# Patient Record
Sex: Female | Born: 1945 | Race: Black or African American | Hispanic: No | Marital: Married | Smoking: Former smoker
Health system: Southern US, Community
[De-identification: ages and names within clinical notes are randomized; demographics above are authoritative.]

## PROBLEM LIST (undated history)

## (undated) DIAGNOSIS — K259 Gastric ulcer, unspecified as acute or chronic, without hemorrhage or perforation: Secondary | ICD-10-CM

## (undated) DIAGNOSIS — G4730 Sleep apnea, unspecified: Secondary | ICD-10-CM

## (undated) DIAGNOSIS — E039 Hypothyroidism, unspecified: Secondary | ICD-10-CM

## (undated) DIAGNOSIS — G43909 Migraine, unspecified, not intractable, without status migrainosus: Secondary | ICD-10-CM

## (undated) DIAGNOSIS — I209 Angina pectoris, unspecified: Secondary | ICD-10-CM

## (undated) DIAGNOSIS — E7800 Pure hypercholesterolemia, unspecified: Secondary | ICD-10-CM

## (undated) DIAGNOSIS — J449 Chronic obstructive pulmonary disease, unspecified: Secondary | ICD-10-CM

## (undated) DIAGNOSIS — I10 Essential (primary) hypertension: Secondary | ICD-10-CM

## (undated) DIAGNOSIS — I639 Cerebral infarction, unspecified: Secondary | ICD-10-CM

## (undated) DIAGNOSIS — M1990 Unspecified osteoarthritis, unspecified site: Secondary | ICD-10-CM

## (undated) DIAGNOSIS — F419 Anxiety disorder, unspecified: Secondary | ICD-10-CM

## (undated) DIAGNOSIS — I2510 Atherosclerotic heart disease of native coronary artery without angina pectoris: Secondary | ICD-10-CM

## (undated) DIAGNOSIS — Z8719 Personal history of other diseases of the digestive system: Secondary | ICD-10-CM

## (undated) DIAGNOSIS — R0602 Shortness of breath: Secondary | ICD-10-CM

## (undated) DIAGNOSIS — E079 Disorder of thyroid, unspecified: Secondary | ICD-10-CM

## (undated) HISTORY — PX: TUBAL LIGATION: SHX77

## (undated) HISTORY — PX: TONSILLECTOMY AND ADENOIDECTOMY: SUR1326

## (undated) HISTORY — PX: DILATION AND CURETTAGE OF UTERUS: SHX78

## (undated) HISTORY — PX: SHOULDER OPEN ROTATOR CUFF REPAIR: SHX2407

## (undated) HISTORY — PX: CARPAL TUNNEL RELEASE: SHX101

## (undated) HISTORY — PX: BUNIONECTOMY: SHX129

## (undated) HISTORY — PX: THYROID SURGERY: SHX805

## (undated) HISTORY — PX: CHOLECYSTECTOMY: SHX55

---

## 1979-02-12 HISTORY — PX: ABDOMINAL HYSTERECTOMY: SHX81

## 1990-07-13 HISTORY — PX: BILE DUCT EXPLORATION: SHX1225

## 2009-10-31 ENCOUNTER — Observation Stay (HOSPITAL_COMMUNITY): Admission: EM | Admit: 2009-10-31 | Discharge: 2009-10-31 | Payer: Self-pay | Admitting: Emergency Medicine

## 2010-01-11 ENCOUNTER — Ambulatory Visit: Payer: Self-pay | Admitting: Cardiology

## 2010-01-11 ENCOUNTER — Ambulatory Visit (HOSPITAL_COMMUNITY)
Admission: RE | Admit: 2010-01-11 | Discharge: 2010-01-11 | Payer: Self-pay | Source: Home / Self Care | Admitting: Neurology

## 2010-01-11 ENCOUNTER — Ambulatory Visit: Payer: Self-pay

## 2010-01-11 ENCOUNTER — Encounter (INDEPENDENT_AMBULATORY_CARE_PROVIDER_SITE_OTHER): Payer: Self-pay | Admitting: Neurology

## 2010-01-11 HISTORY — PX: CATARACT EXTRACTION W/ INTRAOCULAR LENS  IMPLANT, BILATERAL: SHX1307

## 2010-01-12 ENCOUNTER — Encounter: Payer: Self-pay | Admitting: Cardiology

## 2010-03-15 DIAGNOSIS — I639 Cerebral infarction, unspecified: Secondary | ICD-10-CM

## 2010-03-15 HISTORY — DX: Cerebral infarction, unspecified: I63.9

## 2010-03-17 ENCOUNTER — Emergency Department (HOSPITAL_COMMUNITY): Payer: Medicare Other

## 2010-03-17 ENCOUNTER — Inpatient Hospital Stay (HOSPITAL_COMMUNITY)
Admission: EM | Admit: 2010-03-17 | Discharge: 2010-03-22 | DRG: 065 | Disposition: A | Discharge status: Skilled Nursing Facility | Payer: Medicare Other | Attending: Family Medicine | Admitting: Family Medicine

## 2010-03-17 DIAGNOSIS — E039 Hypothyroidism, unspecified: Secondary | ICD-10-CM

## 2010-03-17 DIAGNOSIS — Z8673 Personal history of transient ischemic attack (TIA), and cerebral infarction without residual deficits: Secondary | ICD-10-CM

## 2010-03-17 DIAGNOSIS — I2510 Atherosclerotic heart disease of native coronary artery without angina pectoris: Secondary | ICD-10-CM | POA: Diagnosis present

## 2010-03-17 DIAGNOSIS — I6340 Cerebral infarction due to embolism of unspecified cerebral artery: Principal | ICD-10-CM | POA: Diagnosis present

## 2010-03-17 DIAGNOSIS — Z87891 Personal history of nicotine dependence: Secondary | ICD-10-CM

## 2010-03-17 DIAGNOSIS — G8190 Hemiplegia, unspecified affecting unspecified side: Secondary | ICD-10-CM | POA: Diagnosis present

## 2010-03-17 DIAGNOSIS — E119 Type 2 diabetes mellitus without complications: Secondary | ICD-10-CM | POA: Diagnosis present

## 2010-03-17 DIAGNOSIS — M25519 Pain in unspecified shoulder: Secondary | ICD-10-CM | POA: Diagnosis not present

## 2010-03-17 DIAGNOSIS — G4700 Insomnia, unspecified: Secondary | ICD-10-CM | POA: Diagnosis present

## 2010-03-17 DIAGNOSIS — I10 Essential (primary) hypertension: Secondary | ICD-10-CM | POA: Diagnosis present

## 2010-03-17 DIAGNOSIS — E669 Obesity, unspecified: Secondary | ICD-10-CM | POA: Diagnosis present

## 2010-03-17 DIAGNOSIS — Z881 Allergy status to other antibiotic agents status: Secondary | ICD-10-CM

## 2010-03-17 DIAGNOSIS — Z794 Long term (current) use of insulin: Secondary | ICD-10-CM

## 2010-03-17 DIAGNOSIS — I6350 Cerebral infarction due to unspecified occlusion or stenosis of unspecified cerebral artery: Secondary | ICD-10-CM

## 2010-03-17 DIAGNOSIS — M25569 Pain in unspecified knee: Secondary | ICD-10-CM | POA: Diagnosis not present

## 2010-03-17 DIAGNOSIS — I639 Cerebral infarction, unspecified: Secondary | ICD-10-CM

## 2010-03-17 DIAGNOSIS — E7800 Pure hypercholesterolemia, unspecified: Secondary | ICD-10-CM | POA: Diagnosis present

## 2010-03-17 DIAGNOSIS — E785 Hyperlipidemia, unspecified: Secondary | ICD-10-CM | POA: Diagnosis present

## 2010-03-17 DIAGNOSIS — F411 Generalized anxiety disorder: Secondary | ICD-10-CM | POA: Diagnosis present

## 2010-03-17 DIAGNOSIS — Z882 Allergy status to sulfonamides status: Secondary | ICD-10-CM

## 2010-03-17 DIAGNOSIS — Z7982 Long term (current) use of aspirin: Secondary | ICD-10-CM

## 2010-03-17 LAB — CK TOTAL AND CKMB (NOT AT ARMC)
CK, MB: 2 ng/mL (ref 0.3–4.0)
Relative Index: 0.8 (ref 0.0–2.5)

## 2010-03-17 LAB — POCT CARDIAC MARKERS: Myoglobin, poc: 119 ng/mL (ref 12–200)

## 2010-03-17 LAB — COMPREHENSIVE METABOLIC PANEL
AST: 30 U/L (ref 0–37)
Albumin: 4.1 g/dL (ref 3.5–5.2)
Chloride: 103 mEq/L (ref 96–112)
Creatinine, Ser: 1 mg/dL (ref 0.4–1.2)
GFR calc Af Amer: >60 mL/min (ref >60)
Total Bilirubin: 0.5 mg/dL (ref 0.3–1.2)
Total Protein: 7.4 g/dL (ref 6.0–8.3)

## 2010-03-17 LAB — BASIC METABOLIC PANEL
CO2: 23 mEq/L (ref 19–32)
GFR calc non Af Amer: >60 mL/min (ref >60)
Glucose, Bld: 280 mg/dL — ABNORMAL HIGH (ref 70–99)
Potassium: 4.2 mEq/L (ref 3.5–5.1)
Sodium: 139 mEq/L (ref 135–145)

## 2010-03-17 LAB — URINALYSIS, ROUTINE W REFLEX MICROSCOPIC
Hgb urine dipstick: NEGATIVE
Protein, ur: NEGATIVE mg/dL
Specific Gravity, Urine: 1.015 (ref 1.005–1.030)
Urine Glucose, Fasting: 250 mg/dL — AB
pH: 7 (ref 5.0–8.0)

## 2010-03-17 LAB — PROTIME-INR: INR: 0.99 (ref 0.00–1.49)

## 2010-03-17 LAB — CBC
MCHC: 32.9 g/dL (ref 30.0–36.0)
RDW: 12.4 % (ref 11.5–15.5)

## 2010-03-18 LAB — CARDIAC PANEL(CRET KIN+CKTOT+MB+TROPI)
CK, MB: 1.8 ng/mL (ref 0.3–4.0)
CK, MB: 1.8 ng/mL (ref 0.3–4.0)
Relative Index: 0.6 (ref 0.0–2.5)
Relative Index: 0.6 (ref 0.0–2.5)
Total CK: 283 U/L — ABNORMAL HIGH (ref 7–177)
Troponin I: 0.01 ng/mL (ref 0.00–0.06)
Troponin I: 0.01 ng/mL (ref 0.00–0.06)

## 2010-03-18 LAB — BASIC METABOLIC PANEL
Calcium: 9.1 mg/dL (ref 8.4–10.5)
GFR calc Af Amer: >60 mL/min (ref >60)
GFR calc non Af Amer: >60 mL/min (ref >60)
Potassium: 3.8 mEq/L (ref 3.5–5.1)
Sodium: 138 mEq/L (ref 135–145)

## 2010-03-18 LAB — LIPID PANEL
HDL: 32 mg/dL — ABNORMAL LOW (ref >39)
Total CHOL/HDL Ratio: 5.8 RATIO
VLDL: 50 mg/dL — ABNORMAL HIGH (ref 0–40)

## 2010-03-18 LAB — HEMOGLOBIN A1C: Hgb A1c MFr Bld: 7.8 % — ABNORMAL HIGH (ref <5.7)

## 2010-03-19 ENCOUNTER — Observation Stay (HOSPITAL_COMMUNITY): Payer: Medicare Other

## 2010-03-19 DIAGNOSIS — G459 Transient cerebral ischemic attack, unspecified: Secondary | ICD-10-CM

## 2010-03-19 LAB — GLUCOSE, CAPILLARY
Glucose-Capillary: 101 mg/dL — ABNORMAL HIGH (ref 70–99)
Glucose-Capillary: 131 mg/dL — ABNORMAL HIGH (ref 70–99)

## 2010-03-20 LAB — GLUCOSE, CAPILLARY: Glucose-Capillary: 162 mg/dL — ABNORMAL HIGH (ref 70–99)

## 2010-03-21 ENCOUNTER — Inpatient Hospital Stay (HOSPITAL_COMMUNITY): Payer: Medicare Other

## 2010-03-21 DIAGNOSIS — E119 Type 2 diabetes mellitus without complications: Secondary | ICD-10-CM

## 2010-03-21 DIAGNOSIS — I6350 Cerebral infarction due to unspecified occlusion or stenosis of unspecified cerebral artery: Secondary | ICD-10-CM

## 2010-03-21 DIAGNOSIS — I10 Essential (primary) hypertension: Secondary | ICD-10-CM

## 2010-03-21 DIAGNOSIS — E039 Hypothyroidism, unspecified: Secondary | ICD-10-CM

## 2010-03-21 LAB — GLUCOSE, CAPILLARY
Glucose-Capillary: 197 mg/dL — ABNORMAL HIGH (ref 70–99)
Glucose-Capillary: 260 mg/dL — ABNORMAL HIGH (ref 70–99)
Glucose-Capillary: 220 mg/dL — ABNORMAL HIGH (ref 70–99)

## 2010-03-22 LAB — GLUCOSE, CAPILLARY
Glucose-Capillary: 189 mg/dL — ABNORMAL HIGH (ref 70–99)
Glucose-Capillary: 79 mg/dL (ref 70–99)

## 2010-03-22 LAB — CBC
HCT: 37 % (ref 36.0–46.0)
MCV: 86.4 fL (ref 78.0–100.0)
RDW: 12.8 % (ref 11.5–15.5)
WBC: 9.9 10*3/uL (ref 4.0–10.5)

## 2010-03-22 LAB — BASIC METABOLIC PANEL
BUN: 8 mg/dL (ref 6–23)
GFR calc non Af Amer: >60 mL/min (ref >60)
Glucose, Bld: 146 mg/dL — ABNORMAL HIGH (ref 70–99)
Potassium: 3.5 mEq/L (ref 3.5–5.1)

## 2010-03-26 NOTE — Discharge Summary (Signed)
[name], [name] NO.:  [id number]  MEDICAL RECORD NO.:  [id number]           PATIENT TYPE:  E  LOCATION:  [facility]                         FACILITY:  [facility]  PHYSICIAN:  Barbara Ramp, MD        DATE OF BIRTH:  January 07, 1946  DATE OF ADMISSION:  03/17/2010 DATE OF DISCHARGE:                              DISCHARGE SUMMARY   PRIMARY CARE PHYSICIAN:  [name], MD at [facility].  DISCHARGE DIAGNOSES: 1. 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 2. Hypertension. 3. Hyperlipidemia. 4. Diabetes. 5. Anxiety. 6. Insomnia. 7. Hypothyroidism. 8. Coronary artery disease. 9. History of transient ischemic attack 2-3 years ago.  DISCHARGE MEDICATIONS: 1. Norvasc 5 mg p.o. daily. 2. Aspirin 325 mg p.o. daily. 3. Celexa 20 mg p.o. daily. 4. Plavix 75 mg p.o. daily. 5. Xalatan eyedrops 1 drop in each eye at bedtime. 6. Levothyroxine 75 mcg p.o. daily. 7. Simvastatin 20 mg p.o. at bedtime. 8. Humulin 70/30 of 60 units q.a.m. and 30 units at bedtime.  CONSULTATION:  Neurology.  LABORATORY DATA:  Pertinent labs and studies; 1. Head CT on March 17, 2010 that showed acute/subacute right     parietal infarct.  Thrombus versus slow flow within the right     middle cerebral artery suspected.  Underlying age-advanced small-     vessel ischemic change. 2. MRI of the head on March 17, 2010 that showed occlusion of the     right middle cerebral artery immediately beyond the first branch.     A 4-5 cm region of acute infarction affecting the insula and     parietal region of the right hemisphere swelling, but no hemorrhage     or shift. 3. MRI of the right knee on March 19, 2010 that showed mild     tricompartmental degenerative changes.  No acute bony findings.     Mild mucoid degeneration of the ACL.  No meniscal tears.  Distal     quadriceps tendinopathy without tear.  No joint effusion. 4. CT head on March 19, 2010 that showed  interval evolution of right     parietal infarct noted, without evidence for hemorrhagic conversion     allowing for technique. 5. Left shoulder x-ray on March 21, 2010 that showed no acute     abnormalities.  Moderately severe arthritis. 6. Fasting lipid profile that showed total cholesterol 187, LDL 105,     HDL 32, and triglyceride 251. 7. TSH that was 1.319. 8. Hemoglobin A1c was 7.8. 9. A 2-D echo that showed systolic function was normal with an     ejection fraction of 55-60%.  BRIEF HOSPITAL COURSE: 1. This is a 65 year old female with a past medical history of     hypertension, hyperlipidemia, and diabetes who presented with left     lower extremity weakness, some confusion with initial MRI that     showed acute infarct affecting the insula and parietal region.  The     patient had subsequent 2-D echo that did not reveal any source for     embolic stroke with  normal ejection fraction of 55-60%.  During the     course of hospitalization, the patient's stroke progressed from     left-sided hemineglect to left-sided paralysis.  The patient was     seen by Neurology, Physical Therapy, and Speech Therapy during this     hospitalization for assessment and plan.  Per Neurology, the     patient is to continue on the Plavix for future stroke prevention.     Her blood pressure remained stable during this hospitalization with     discharge blood pressure of 134/75.  The patient was evaluated by     Physical Therapy, who recommended the patient be placed in a     skilled nursing facility with rehabilitation facility for     improvement of quality of life.  The patient was evaluated by     Speech Therapy, and was recommended to have a dysphagia II diet.     The patient does have great support in her daughters, who live out     of state and are coming to stay with the patient during her     transition to a skilled nursing facility. 2. Hypertension.  The patient is to continue on her  medication of     Norvasc 5 mg.  Her blood pressure has been well controlled during     this hospitalization. 3. Hyperlipidemia.  Fasting lipid panel is as stated in above.  The     patient is to continue on simvastatin 20 mg p.o. at bedtime for     optimal control of cholesterol for secondary stroke prevention. 4. Diabetes.  Hemoglobin A1c during this hospitalization was 7.8.  It     is important that the patient has tight glycemic control for     prevention of further risk factors and for ischemia.  She will     continue on Humalog 70/30 with 30 units in the night and 60 units     in the a.m. 5. Hypothyroidism.  Her TSH was found to be within normal limits at     1.319.  The patient is to continue on her levothyroxine at 75 mcg     p.o. daily. 6. Depression.  The patient is to continue on Celexa 20 mg p.o. daily.     An SSRI has been shown to improve outcome after stroke, optimal     reason the patient should stay on this medication.  DISCHARGE INSTRUCTIONS: 1. The patient is discharged to a skilled nursing facility with plans     for rehabilitation. 2. Activity is as per Physical Therapy. 3. Diet.  Dysphagia II diet. 4. The patient is to have followup with Neurology per Neurology.  The patient was discharged in stable medical condition to skilled nursing facility.    Barbara Slim, MD   ______________________________ Barbara Ramp, MD   CT/MEDQ  D:  03/22/2010  T:  03/22/2010  Job:  [id number]  cc:   [name], MD  Electronically Signed by CAT TA MD on 03/25/2010 04:02:30 PM Electronically Signed by Denny Levy MD on 03/26/2010 11:20:36 AM

## 2010-04-09 NOTE — H&P (Signed)
[name], [name]                ACCOUNT NO.:  [id number]  MEDICAL RECORD NO.:  [id number]           PATIENT TYPE:  E  LOCATION:  [facility]                         FACILITY:  [facility]  PHYSICIAN:  Paula Compton, MD        DATE OF BIRTH:  1945/09/15  DATE OF ADMISSION:  03/17/2010 DATE OF DISCHARGE:                             HISTORY & PHYSICAL   CHIEF COMPLAINT:  Weakness.  PRIMARY CARE PROVIDER:  [name], Dr. Alwyn Ren.  HISTORY OF PRESENT ILLNESS:  A 65 year old female who awoke last night with confusion and weakness of the left upper extremity.  Husband says she was "talking like a baby" and endorsed left arm weakness and tingling.  The patient states she was dropping things from her hand and felt very incoordinated.  Of note, 1 week prior the patient had similar symptoms with tingling and numbness in the left hand, which resolved within minutes.  The patient went to [name] this a.m., concerned for CVA and was sent to the ED.  Upon evaluation in the ED, the patient was at baseline.  There were no focal neurological deficits. Symptoms have resolved completely.  The patient denied any visual changes at that time, although she does have a history of cataracts and glaucoma.  REVIEW OF SYSTEMS:  No chest pain.  No fever.  No shortness of breath. No change in bowel or bladder.  No recent illness.  Positive weakness. No dysuria, otherwise per above.  EMERGENCY DEPARTMENT COURSE:  The patient was given aspirin 325, fentanyl 100 mg x1, and Zofran 4 mg.  Of note, the patient also endorsed knee pain because she states she fell being uncoordinated this morning, did not hit her head, but hit her knee upon the ground.  ALLERGIES:  SULFA, FLAGYL, ERYTHROMYCIN.  MEDICATIONS: 1. Hydrochlorothiazide 12.5 mg daily. 2. Ambien 10 mg nightly p.r.n. 3. Synthroid 75 mcg daily. 4. VESIcare p.r.n. 5. Humulin 70/30, 9 units q.a.m., 70 units q.p.m. 6. Xalatan eye drops daily. 7.  Azor. 8. Amlodipine. 9. Olmesartan 10/20 mg daily. 10.Aspirin 325 mg daily. 11.Celexa 20 mg daily.  PAST MEDICAL HISTORY: 1. Hypertension. 2. Hyperlipidemia. 3. Diabetes mellitus, type 2. 4. Anxiety. 5. Insomnia. 6. Hypothyroidism. 7. Coronary artery disease.  The patient says she has a blockage;     however, there was no stent placed. 8. History of TIA 2-3 years ago.  PAST SURGICAL HISTORY: 1. Cataract surgery. 2. Bilateral rotator cuff surgery. 3. Cholecystectomy. 4. History of carpal tunnel. 5. History glaucoma. 6. Thyroid surgery, and therefore she was started on Synthroid, could     not give details. 7. Hysterectomy.  SOCIAL HISTORY:  The patient is married.  On disability for carpal tunnel and foot surgeries.  Previously worked at [employer] No tobacco, quit in 1985.  No alcohol.  No illicit drug use.  Has multiple children who currently reside in [place] and [place].  Of note, the patient made to [name] with her husband in July 2011.  FAMILY HISTORY:  Only history of mother is known.  She had diabetes mellitus and end-stage renal disease and  is currently deceased.  No family history of stroke history is known.  PHYSICAL EXAMINATION:  VITAL SIGNS:  Temperature 98.2, heart rate 87, respiratory rate 16, blood pressure 141/63, 95% on room air. GENERAL:  In no acute distress, alert, and oriented x3. HEENT:  3 mm pupils, reactive.  EOMI.  Nonicteric. NECK:  No JVD.  Supple.  No lymphadenopathy. CVS:  Regular rate and rhythm.  No murmur. RESPIRATORY:  CTAB. EXTREMITIES:  No edema.  Right knee tender to palpation of the right patella.  No effusion.  Ligaments intact.  No ecchymosis. NEURO:  The patient follows commands, has difficulty spelling the word world backwards.  Alert and oriented x4.  No hallucinations, but is crying during exam.  Motor is 5/5 bilateral lower extremities.  Motor is equal in the upper extremities.  There is no facial droop.   Sensation is grossly intact.  The patient has normal tone.  Of note per nursing, when the patient was walked, she had ataxia.  Also noted, the patient although was oriented, had some confusion with some commands and began crying.  She was unable to do rapid alternating movements, but did not have any dysmetria.  LABORATORY DATA:  Sodium 139, potassium 4.2, chloride 105, CO2 of 23, BUN 10, creatinine 0.88, glucose 280, calcium 9.4.  PTT 24, INR is 0.99. Point-of-care negative x1.  CBC:  White count 8.9, hemoglobin 12.6, hematocrit 38.3, platelets 289.  IMAGING: 1. CT of head, acute on subacute right parietal infarct, small     physical changes, thrombosed versus slow flow in the right MCA     suspected. 2. MRA in September 2011 showed narrowing in the focal region of the     right MCA which is likely flow reducing.  ASSESSMENT AND PLAN:  A 65-year female admitted with weakness, confusion, concern for transient ischemic attack/subacute cerebrovascular accident. 1. Transient ischemic attack.  We will admit to Neurology for     transient ischemic attack workup.  CT of head shows probable acute     or subacute area of ischemia in the parietal region.  We will     obtain MRI/MRA to visualize ischemia and further differentiate the     region of flow that is decreased in the right middle cerebral     artery.  We will obtain cardiac enzymes and fasting lipid panel.     The patient is currently on full-dose aspirin per report.     Therefore, we will stop Plavix 75 mg daily.  PT/OT. 2. Hypertension.  Blood pressure is fairly controlled.  No medications     taken this a.m. Will allow permissive hypertension.  We will start low-dose     Norvasc 5 mg daily. 3. Diabetes mellitus.  Check A1c.  Continue 70/30 as the patient     currently tolerating p.o. titrate it up as needed sliding scale     insulin. 4. Hyperlipidemia.  Check fasting lipid panel.  No medications     currently, however, will  need statin on discharge. 5. Coronary artery disease.  The patient unable to stand.  We will     cycle cardiac enzymes.  No chest pain at this time.  Obtain EKG. 6. Knee pain.  We will obtain x-ray of right knee.  Start Vicodin     p.r.n. 7. Hypothyroidism.  Check TSH. 8. Prophylaxis.  Lovenox subcu daily. 9. Fluids, electrolytes, nutrition/gastrointestinal.  Diabetic diet.     Saline lock IV fluids.  DISPOSITION:  Pending clinical improvement.  The patient is a full code. Social work to help with medications.     Milinda Antis, MD   ______________________________ Paula Compton, MD    KD/MEDQ  D:  03/17/2010  T:  03/18/2010  Job:  [id number]  Electronically Signed by Milinda Antis MD on 03/27/2010 07:47:09 PM Electronically Signed by Paula Compton MD on 04/09/2010 09:47:13 AM

## 2010-04-16 NOTE — Consult Note (Signed)
NAMEROBBIE, NANGLE                ACCOUNT NO.:  [id number]  MEDICAL RECORD NO.:  [id number]           PATIENT TYPE:  E  LOCATION:  [facility]                         FACILITY:  [facility]  PHYSICIAN:  Joycelyn Schmid, MD   DATE OF BIRTH:  [birth year]  DATE OF CONSULTATION:  03/19/2010 DATE OF DISCHARGE:                                CONSULTATION   CHIEF COMPLAINT:  Left arm weakness.  HISTORY OF PRESENT ILLNESS:  The patient is a 65 year old female with past medical history of TIA, coronary artery disease, diabetes type 2, hypertension, obesity, and medical noncompliance, who developed left- sided weakness 3 days ago while going to the bed.  The patient thought that her weakness in the left lower extremity was secondary to some circulation problem and went to bed.  The patient woke up and continued to feel weak on the left side including the arms and legs.  The patient at that time went to the urgent care and sought further help.  The patient denies having any seizures, trauma, bowel or bladder disturbance at that time.  The patient reports headache which is mainly retro- orbital without any visual changes according to the patient.  The patient reports that her weakness had progressed initially, but is now improving; however, this cannot be confirmed with her family members. The patient does not recall having any palpitation or chest pain.  The patient does not report any fevers, nausea, or vomiting prior to today. The patient did develop some nausea and vomiting at the time of examination.  PAST MEDICAL HISTORY: 1. Hypertension. 2. Diabetes. 3. High cholesterol. 4. TIA x2, resolved without any deficits according to the patient;     first episode was 5 years ago, another one was about 6 months ago. 5. Coronary artery disease. 6. Cataract surgery. 7. Cholecystectomy surgery. 8. Thyroid surgery.  MEDICATIONS: 1. Hydrochlorothiazide. 2. Ambien. 3. Synthroid. 4. VESIcare. 5.  Humulin 90 units in the morning, 70 units in the evening. 6. Xalatan eyedrops. 7. Norvasc. 8. Aspirin. 9. Celexa.  ALLERGIES:  SULFA, FLAGYL, and ERYTHROMYCIN.  FAMILY MEDICAL HISTORY:  Mother had diabetes.  The patient does not know family history from her father's side.  SOCIAL HISTORY:  The patient is not smoking at present.  The patient did smoke until 1985.  The patient denies having any alcohol addiction or illicit drug use.  REVIEW OF SYMPTOMS:  All 10 systems were reviewed and were all negative except as mentioned in the HPI.  PHYSICAL EXAMINATION:  VITAL SIGNS:  Blood pressure 138/80, pulse of 90, respiratory rate of 20, 95% saturation on room air, temperature is 97. MENTAL STATUS:  Alert and oriented x3.  Carries out two-step commands. Cranial Nerves:  Eyes, extraocular movement is intact.  There is ptosis. There is a left lower visual field defect on the lateral side.  Slow extraocular movements, however, they are still present.  Face is symmetric and the tongue is midline as well as the uvula is also midline.  There is no dysarthria or aphasia.  The patient does not have any facial droop.  The patient has a left-sided sensation deficit  including on the head in V1-V3 distribution.  Shoulder and head turn are normal.  Coordination:  The patient's finger-to-nose coordination is good on right side, however, on the left side, it is impaired by muscle weakness as well as spatial loss for the muscle movement from the possible parietal stroke.  Gait was not assessed.  The patient had a fall in the past.  Motor:  Right upper extremity and lower extremity are 5/5, left-sided hemineglect, left-sided parietal drift.  Left arm weakness about 3-4/5, left leg is 4/5.  Deep tendon reflexes are normal on the right side and diminished on the left.  Able to read parts of the words and sentences, but cannot read clearly. PULMONARY:  Clear to auscultation bilaterally.  No rhonchi, no  rales, no wheezing. CARDIOVASCULAR:  S1 and S2 normal.  No murmur. NECK:  Does not have any bruit.  Sensation is normal except on the left side arm and leg, there is a loss of sensation which includes spine as well as crude touch.  LABORATORY DATA:  Sodium 138, potassium 3.8, chloride 102, bicarb 24, BUN 9, creatinine 9.94, glucose is 271.  Hemoglobin is 12.6, white count is 8.9, platelet 284.  A1c is 7.8.  A 2-echo showed 55-60% ejection fraction without any evidence of embolic source.  Triglyceride is 251, cholesterol is 187, HDL is 32, LDL is 105.  IMAGING STUDIES:  MRI shows 4-5 cm region of acute infarct, affecting insula and parietal region.  ASSESSMENT AND PLAN:  The patient is a 65 year old female with multiple risk factors for cardiovascular attack, presenting with 3-day old left- sided weakness which is progressing.  MRI shows acute infarct affecting insula and parietal region.  Deficits are stable and there is a left- sided hemineglect.  The patient's 2-D echo and Dopplers were performed today and did not reveal any source for embolic stroke as well as any internal carotid artery stenosis.  The patient could not afford Plavix in the past and therefore, was only taking aspirin.  We will recommend that the patient should be on a single antiplatelet therapy, either being aspirin 325 mg once a day or Plavix 75 mg once a day.  We will also advise that TEE be obtained if TTE is unrevealing.  The patient, at the time of exam, had some nausea and vomiting and some progression of deficit according to nursing staff.  CT scan was ordered to further evaluate progression of stroke and possibility of increased intracranial pressure.  We will recommend the risk factor be aggressively controlled including diabetes, hypertension, and compliance be assured.  Thank you for consultation.     Clerance Lav, MD PhD   ______________________________ Joycelyn Schmid, MD    RS/MEDQ   D:  03/19/2010  T:  03/20/2010  Job:  [id number]  Electronically Signed by Clerance Lav MD PHD on 03/20/2010 04:55:50 PM Electronically Signed by Joycelyn Schmid  on 04/16/2010 12:33:38 PM

## 2010-04-26 LAB — CBC
HCT: 36.7 % (ref 36.0–46.0)
Hemoglobin: 12.3 g/dL (ref 12.0–15.0)
WBC: 9.4 10*3/uL (ref 4.0–10.5)

## 2010-04-26 LAB — URINALYSIS, ROUTINE W REFLEX MICROSCOPIC
Bilirubin Urine: NEGATIVE
Hgb urine dipstick: NEGATIVE
Protein, ur: NEGATIVE mg/dL
Urobilinogen, UA: 0.2 mg/dL (ref 0.0–1.0)

## 2010-04-26 LAB — POCT CARDIAC MARKERS
CKMB, poc: 1.3 ng/mL (ref 1.0–8.0)
Myoglobin, poc: 106 ng/mL (ref 12–200)
Troponin i, poc: <0.05 ng/mL (ref 0.00–0.09)

## 2010-04-26 LAB — DIFFERENTIAL
Lymphs Abs: 2.5 10*3/uL (ref 0.7–4.0)
Monocytes Relative: 5 % (ref 3–12)
Neutro Abs: 6.3 10*3/uL (ref 1.7–7.7)
Neutrophils Relative %: 67 % (ref 43–77)

## 2010-04-26 LAB — COMPREHENSIVE METABOLIC PANEL
BUN: 13 mg/dL (ref 6–23)
Calcium: 9.5 mg/dL (ref 8.4–10.5)
Glucose, Bld: 320 mg/dL — ABNORMAL HIGH (ref 70–99)
Sodium: 139 mEq/L (ref 135–145)
Total Protein: 7.1 g/dL (ref 6.0–8.3)

## 2010-04-26 LAB — GLUCOSE, CAPILLARY: Glucose-Capillary: 131 mg/dL — ABNORMAL HIGH (ref 70–99)

## 2010-04-26 LAB — URINE MICROSCOPIC-ADD ON

## 2010-07-04 ENCOUNTER — Emergency Department (HOSPITAL_COMMUNITY): Payer: Medicare Other

## 2010-07-04 ENCOUNTER — Emergency Department (HOSPITAL_COMMUNITY)
Admission: EM | Admit: 2010-07-04 | Discharge: 2010-07-05 | Disposition: A | Discharge status: Home / Self Care | Payer: Medicare Other | Attending: Emergency Medicine | Admitting: Emergency Medicine

## 2010-07-04 DIAGNOSIS — Z7982 Long term (current) use of aspirin: Secondary | ICD-10-CM | POA: Insufficient documentation

## 2010-07-04 DIAGNOSIS — Z79899 Other long term (current) drug therapy: Secondary | ICD-10-CM | POA: Insufficient documentation

## 2010-07-04 DIAGNOSIS — E785 Hyperlipidemia, unspecified: Secondary | ICD-10-CM | POA: Insufficient documentation

## 2010-07-04 DIAGNOSIS — I10 Essential (primary) hypertension: Secondary | ICD-10-CM | POA: Insufficient documentation

## 2010-07-04 DIAGNOSIS — Z794 Long term (current) use of insulin: Secondary | ICD-10-CM | POA: Insufficient documentation

## 2010-07-04 DIAGNOSIS — R29898 Other symptoms and signs involving the musculoskeletal system: Secondary | ICD-10-CM | POA: Insufficient documentation

## 2010-07-04 DIAGNOSIS — Z8673 Personal history of transient ischemic attack (TIA), and cerebral infarction without residual deficits: Secondary | ICD-10-CM | POA: Insufficient documentation

## 2010-07-04 DIAGNOSIS — E039 Hypothyroidism, unspecified: Secondary | ICD-10-CM | POA: Insufficient documentation

## 2010-07-04 DIAGNOSIS — E119 Type 2 diabetes mellitus without complications: Secondary | ICD-10-CM | POA: Insufficient documentation

## 2010-07-04 DIAGNOSIS — R51 Headache: Secondary | ICD-10-CM | POA: Insufficient documentation

## 2010-07-04 DIAGNOSIS — R000 Tachycardia, unspecified: Secondary | ICD-10-CM | POA: Insufficient documentation

## 2010-07-04 LAB — DIFFERENTIAL
Basophils Absolute: 0.1 10*3/uL (ref 0.0–0.1)
Basophils Relative: 1 % (ref 0–1)
Eosinophils Absolute: 0.4 10*3/uL (ref 0.0–0.7)
Eosinophils Relative: 3 % (ref 0–5)
Lymphocytes Relative: 47 % — ABNORMAL HIGH (ref 12–46)
Lymphs Abs: 5.3 10*3/uL — ABNORMAL HIGH (ref 0.7–4.0)
Monocytes Absolute: 0.8 10*3/uL (ref 0.1–1.0)
Monocytes Relative: 7 % (ref 3–12)
Neutro Abs: 4.7 10*3/uL (ref 1.7–7.7)
Neutrophils Relative %: 42 % — ABNORMAL LOW (ref 43–77)

## 2010-07-04 LAB — BASIC METABOLIC PANEL
BUN: 15 mg/dL (ref 6–23)
CO2: 22 mEq/L (ref 19–32)
Calcium: 9.7 mg/dL (ref 8.4–10.5)
Chloride: 101 mEq/L (ref 96–112)
Creatinine, Ser: 0.65 mg/dL (ref 0.4–1.2)
GFR calc Af Amer: >60 mL/min (ref >60)
GFR calc non Af Amer: >60 mL/min (ref >60)
Glucose, Bld: 304 mg/dL — ABNORMAL HIGH (ref 70–99)
Potassium: 4 mEq/L (ref 3.5–5.1)
Sodium: 136 mEq/L (ref 135–145)

## 2010-07-04 LAB — CBC
HCT: 39.8 % (ref 36.0–46.0)
MCHC: 33.9 g/dL (ref 30.0–36.0)
RDW: 12.3 % (ref 11.5–15.5)

## 2010-07-04 LAB — POCT CARDIAC MARKERS: Myoglobin, poc: 81 ng/mL (ref 12–200)

## 2010-07-14 ENCOUNTER — Emergency Department (HOSPITAL_COMMUNITY): Payer: Medicare Other

## 2010-07-14 ENCOUNTER — Observation Stay (HOSPITAL_COMMUNITY)
Admission: EM | Admit: 2010-07-14 | Discharge: 2010-07-23 | DRG: 881 | Disposition: A | Discharge status: Home Health | Payer: Medicare Other | Attending: Family Medicine | Admitting: Family Medicine

## 2010-07-14 DIAGNOSIS — Z993 Dependence on wheelchair: Secondary | ICD-10-CM | POA: Insufficient documentation

## 2010-07-14 DIAGNOSIS — R0609 Other forms of dyspnea: Secondary | ICD-10-CM | POA: Insufficient documentation

## 2010-07-14 DIAGNOSIS — Z79899 Other long term (current) drug therapy: Secondary | ICD-10-CM | POA: Insufficient documentation

## 2010-07-14 DIAGNOSIS — F329 Major depressive disorder, single episode, unspecified: Secondary | ICD-10-CM | POA: Insufficient documentation

## 2010-07-14 DIAGNOSIS — M6281 Muscle weakness (generalized): Secondary | ICD-10-CM | POA: Insufficient documentation

## 2010-07-14 DIAGNOSIS — Z8673 Personal history of transient ischemic attack (TIA), and cerebral infarction without residual deficits: Secondary | ICD-10-CM | POA: Insufficient documentation

## 2010-07-14 DIAGNOSIS — Z794 Long term (current) use of insulin: Secondary | ICD-10-CM | POA: Insufficient documentation

## 2010-07-14 DIAGNOSIS — I10 Essential (primary) hypertension: Secondary | ICD-10-CM | POA: Insufficient documentation

## 2010-07-14 DIAGNOSIS — R0989 Other specified symptoms and signs involving the circulatory and respiratory systems: Secondary | ICD-10-CM | POA: Insufficient documentation

## 2010-07-14 DIAGNOSIS — E119 Type 2 diabetes mellitus without complications: Secondary | ICD-10-CM | POA: Insufficient documentation

## 2010-07-14 DIAGNOSIS — F411 Generalized anxiety disorder: Secondary | ICD-10-CM | POA: Insufficient documentation

## 2010-07-14 DIAGNOSIS — E785 Hyperlipidemia, unspecified: Secondary | ICD-10-CM | POA: Insufficient documentation

## 2010-07-14 DIAGNOSIS — R079 Chest pain, unspecified: Principal | ICD-10-CM | POA: Insufficient documentation

## 2010-07-14 DIAGNOSIS — E039 Hypothyroidism, unspecified: Secondary | ICD-10-CM | POA: Insufficient documentation

## 2010-07-14 LAB — CARDIAC PANEL(CRET KIN+CKTOT+MB+TROPI)
Relative Index: INVALID (ref 0.0–2.5)
Total CK: 99 U/L (ref 7–177)
Troponin I: <0.3 ng/mL (ref <0.30)

## 2010-07-14 LAB — COMPREHENSIVE METABOLIC PANEL
ALT: 21 U/L (ref 0–35)
AST: 22 U/L (ref 0–37)
Albumin: 3.7 g/dL (ref 3.5–5.2)
CO2: 24 mEq/L (ref 19–32)
Chloride: 102 mEq/L (ref 96–112)
GFR calc Af Amer: >60 mL/min (ref >60)
GFR calc non Af Amer: >60 mL/min (ref >60)
Potassium: 3.6 mEq/L (ref 3.5–5.1)
Sodium: 139 mEq/L (ref 135–145)
Total Bilirubin: 0.3 mg/dL (ref 0.3–1.2)

## 2010-07-14 LAB — DIFFERENTIAL
Basophils Relative: 1 % (ref 0–1)
Lymphocytes Relative: 43 % (ref 12–46)
Lymphs Abs: 3.3 10*3/uL (ref 0.7–4.0)
Monocytes Absolute: 0.4 10*3/uL (ref 0.1–1.0)
Monocytes Relative: 5 % (ref 3–12)
Neutro Abs: 3.7 10*3/uL (ref 1.7–7.7)

## 2010-07-14 LAB — URINALYSIS, ROUTINE W REFLEX MICROSCOPIC
Bilirubin Urine: NEGATIVE
Hgb urine dipstick: NEGATIVE
Ketones, ur: NEGATIVE mg/dL
Nitrite: NEGATIVE
Urobilinogen, UA: 0.2 mg/dL (ref 0.0–1.0)

## 2010-07-14 LAB — CBC
HCT: 40.2 % (ref 36.0–46.0)
Hemoglobin: 13.5 g/dL (ref 12.0–15.0)
MCH: 29.2 pg (ref 26.0–34.0)
MCHC: 33.6 g/dL (ref 30.0–36.0)
MCV: 87 fL (ref 78.0–100.0)

## 2010-07-14 LAB — URINE MICROSCOPIC-ADD ON

## 2010-07-15 DIAGNOSIS — R627 Adult failure to thrive: Secondary | ICD-10-CM

## 2010-07-15 DIAGNOSIS — R0789 Other chest pain: Secondary | ICD-10-CM

## 2010-07-15 DIAGNOSIS — I6990 Unspecified sequelae of unspecified cerebrovascular disease: Secondary | ICD-10-CM

## 2010-07-15 LAB — GLUCOSE, CAPILLARY
Glucose-Capillary: 246 mg/dL — ABNORMAL HIGH (ref 70–99)
Glucose-Capillary: 193 mg/dL — ABNORMAL HIGH (ref 70–99)

## 2010-07-15 LAB — BASIC METABOLIC PANEL
CO2: 26 mEq/L (ref 19–32)
GFR calc non Af Amer: >60 mL/min (ref >60)
Glucose, Bld: 271 mg/dL — ABNORMAL HIGH (ref 70–99)
Potassium: 3.8 mEq/L (ref 3.5–5.1)
Sodium: 140 mEq/L (ref 135–145)

## 2010-07-15 LAB — HEMOGLOBIN A1C
Hgb A1c MFr Bld: 6.9 % — ABNORMAL HIGH (ref <5.7)
Mean Plasma Glucose: 151 mg/dL — ABNORMAL HIGH (ref <117)

## 2010-07-15 LAB — DIFFERENTIAL
Basophils Absolute: 0.1 10*3/uL (ref 0.0–0.1)
Eosinophils Relative: 4 % (ref 0–5)
Lymphocytes Relative: 48 % — ABNORMAL HIGH (ref 12–46)
Monocytes Absolute: 0.6 10*3/uL (ref 0.1–1.0)
Monocytes Relative: 8 % (ref 3–12)
Neutro Abs: 2.7 10*3/uL (ref 1.7–7.7)

## 2010-07-15 LAB — CBC
HCT: 36.9 % (ref 36.0–46.0)
Hemoglobin: 12.1 g/dL (ref 12.0–15.0)
RBC: 4.23 MIL/uL (ref 3.87–5.11)
RDW: 12.3 % (ref 11.5–15.5)
WBC: 6.9 10*3/uL (ref 4.0–10.5)

## 2010-07-16 LAB — BASIC METABOLIC PANEL
BUN: 11 mg/dL (ref 6–23)
Calcium: 9.4 mg/dL (ref 8.4–10.5)
Creatinine, Ser: 0.59 mg/dL (ref 0.4–1.2)
GFR calc non Af Amer: >60 mL/min (ref >60)
Glucose, Bld: 209 mg/dL — ABNORMAL HIGH (ref 70–99)
Sodium: 140 mEq/L (ref 135–145)

## 2010-07-16 LAB — CBC
HCT: 38.1 % (ref 36.0–46.0)
MCHC: 33.3 g/dL (ref 30.0–36.0)
RDW: 12.2 % (ref 11.5–15.5)

## 2010-07-17 LAB — GLUCOSE, CAPILLARY
Glucose-Capillary: 228 mg/dL — ABNORMAL HIGH (ref 70–99)
Glucose-Capillary: 195 mg/dL — ABNORMAL HIGH (ref 70–99)

## 2010-07-18 LAB — GLUCOSE, CAPILLARY: Glucose-Capillary: 113 mg/dL — ABNORMAL HIGH (ref 70–99)

## 2010-07-18 NOTE — H&P (Signed)
[name], [name]                ACCOUNT NO.:  [id number]  MEDICAL RECORD NO.:  [id number]           PATIENT TYPE:  E  LOCATION:  [facility]                         FACILITY:  [facility]  PHYSICIAN:  Leighton Roach Anvika Gashi, M.D.DATE OF BIRTH:  May 11, 1945  DATE OF ADMISSION:  07/14/2010 DATE OF DISCHARGE:                             HISTORY & PHYSICAL   PRIMARY CARE PROVIDER:  Dr. Alwyn Ren at [facility].  CHIEF COMPLAINT:  Chest pain.  HISTORY OF PRESENT ILLNESS:  This is a 65 year old female presenting with chest pain.  Has been going on for the past few weeks.  Across middle and left chest and upper abdomen.  Pain goes through chest to the back.  Nonexertional.  Does not know what makes it better or worse. Comes out at rest and with activity.  Keeping still may help intermittent throughout the day.  Occasional associated dyspnea.  The patient had right parietal stroke in February and now has residual left- sided weakness, particularly in the right upper extremities, but also in legs.  Difficulty ambulating.  Uses wheelchair and walker at home.  Was discharged to a skilled nursing facility after discharge from the hospital following the stroke.  Has been at home for a couple of weeks now since middle of May.  Has been finding it very difficult to manage at home.  Lives with husband who was unable to provide the full time care she needs.  The patient asking to go back to nursing home.  The patient very tearful during interview.  Expected to recuperate more fully.  Did not expect to be this debilitated.  REVIEW OF SYSTEMS:  Denies nausea or vomiting.  Wears diapers. Continent of urine for several minutes but unable to hold for prolonged period.  Denies dysuria. Continent of stool.  Needs help ambulating.  No fevers.  PAST MEDICAL HISTORY: 1. Hypertension. 2. Type 2 diabetes. 3. Anxiety. 4. Depression. 5. High cholesterol. 6. Hypothyroidism. 7. Status post right parietal CVA in  February 2012.  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  SOCIAL HISTORY:  Lives with husband in [place].  Quit smoking in 1985.  Denies alcohol or drugs.  ALLERGIES:  SULFA causes rash.  MEDICATIONS: 1. Amlodipine 5 mg p.o. daily. 2. Aspirin 325 mg p.o. daily. 3. Plavix 75 mg p.o. daily. 4. Cymbalta 30 mg p.o. daily. 5. Levothyroxine 50 mcg p.o. daily. 6. MiraLax 17 g p.o. daily. 7. Lantus 6 units q.a.m. 8. Novolin 70/30 60 units q.a.m. and 26 units q.p.m. 9. Senokot 1 tablet nightly. 10.Simvastatin 20 mg p.o. daily. 11.Xalatan ophthalmic 1 drop each eye nightly. 12.Tylenol No.3 one tablet p.o. q.6 h. p.r.n. neuropathic pain in left     arm at night.  PHYSICAL EXAMINATION:  VITAL SIGNS:  Heart rate 91, blood pressure 128/74, respiratory rate 22, temperature 98.0, and 100% on room air. GENERAL:  Not apparent distress. HEENT:  Moist mucous membranes, pupils equal, round, reactive to light and accommodation. CARDIOVASCULAR:  Regular rate and rhythm, normal S1 and S2, no murmurs or gallops, nontender to palpation  of chest. PULMONARY:  Clear to auscultation bilaterally with no increased work of breathing. ABDOMEN:  Soft, nontender, nondistended, normoactive bowel sounds. EXTREMITIES: Warm, dry and no pedal edema. NEURO:  Asymmetric smile (questionable right facial droop) but intact sensation throughout and intact facial muscle strength.  2-3/5 strength in the left upper extremity, able to wiggle her fingers.  3/5 strength in the left lower extremity.  Intact sensation throughout.  Strength fully intact on right side. PSYCH:  Alert and oriented x3, appropriate to questions, tearful during interview.  LABORATORY DATA:  Urinalysis significant for greater than 1000 glucose. Cardiac enzymes negative x1.  Sodium 139, potassium 3.6, chloride 102, bicarb 24, glucose 294, creatinine 0.66, albumin  3.7, white blood count 7.6, hemoglobin 14.2, and platelets 288.  PROCEDURE:  Chest x-ray showed no active disease.  ASSESSMENT AND PLAN:  This is a 65 year old female with status post right parietal ischemic stroke in February 2012, who just returned home from a skilled nursing facility a few weeks ago, presenting with chest pain and need for permanent placement at a skilled nursing facility. 1. Chest pain.  Atypical.  Likely secondary to anxiety.  We will     complete ACS rule out with total cardiac enzymes x3.  We will     repeat ECG in the a.m.  The patient is already on full-dose     aspirin. 2. Type 2 diabetes.  We will hold 70/30 for now and give Lantus only.     We will give 50 units q.a.m. and put on sensitive sliding-scale     insulin.  The patient with no proteinuria, we will consider     starting ACE inhibitor for renal protection. 3. Hyperlipidemia.  We will continue home simvastatin. 4. Hypertension.  Stable currently.  We will continue home Norvasc.     We will consider Norvasc to thiazide/ACE inhibitor due to mortality     benefit in patients following stroke. 5. Status stroke.  No benefits to aspirin plus Plavix, but increased     bleeding risk with both that.  We will discontinue aspirin since     the patient had stroke on full-dose aspirin and just keep on Plavix     only. 6. Left-sided neuropathy.  We will start Neurontin.  We will give     Tylenol and oxycodone p.r.n. pain. 7. Depression.  We will increase Lexapro from 20 mg to 40 mg daily. 8. FENGI.  No IV fluids needed at this time.  Carbohydrate modified     diet. 9. Prophylaxis.  Heparin subcu for DVT prophylaxis.  Protonix for GERD     prophylaxis. 10.Disposition.  We will request social work consult in the a.m.     regarding placement.  Pending clinical improvement, ACS rule out .    ______________________________ Priscella Mann, MD   ______________________________ Leighton Roach Azarius Lambson,  M.D.    AO/MEDQ  D:  07/15/2010  T:  07/16/2010  Job:  [id number]  Electronically Signed by Priscella Mann MD on 07/18/2010 10:29:52 AM Electronically Signed by Acquanetta Belling M.D. on 07/18/2010 01:24:10 PM

## 2010-07-19 LAB — GLUCOSE, CAPILLARY: Glucose-Capillary: 194 mg/dL — ABNORMAL HIGH (ref 70–99)

## 2010-07-20 LAB — GLUCOSE, CAPILLARY
Glucose-Capillary: 197 mg/dL — ABNORMAL HIGH (ref 70–99)
Glucose-Capillary: 180 mg/dL — ABNORMAL HIGH (ref 70–99)
Glucose-Capillary: 129 mg/dL — ABNORMAL HIGH (ref 70–99)

## 2010-07-21 LAB — GLUCOSE, CAPILLARY
Glucose-Capillary: 182 mg/dL — ABNORMAL HIGH (ref 70–99)
Glucose-Capillary: 129 mg/dL — ABNORMAL HIGH (ref 70–99)

## 2010-07-22 LAB — GLUCOSE, CAPILLARY
Glucose-Capillary: 115 mg/dL — ABNORMAL HIGH (ref 70–99)
Glucose-Capillary: 183 mg/dL — ABNORMAL HIGH (ref 70–99)
Glucose-Capillary: 124 mg/dL — ABNORMAL HIGH (ref 70–99)

## 2010-07-23 LAB — GLUCOSE, CAPILLARY: Glucose-Capillary: 214 mg/dL — ABNORMAL HIGH (ref 70–99)

## 2010-07-30 NOTE — Discharge Summary (Signed)
  NAMENALIYA, [name]                ACCOUNT NO.:  [id number]  MEDICAL RECORD NO.:  [id number]  LOCATION:  5522                         FACILITY:  [facility]  PHYSICIAN:  Leighton Roach Radek Carnero, M.D.DATE OF BIRTH:  1945/05/04  DATE OF ADMISSION:  07/14/2010 DATE OF DISCHARGE:  07/23/2010                              DISCHARGE SUMMARY   ADDENDUM  The patient was discharged home on July 23, 2010 with Home Health,  Skilled Nursing, PT, and OT.  The patient did not meet criteria to  go to skilled nursing facility.  Social work has requested a home aide to help care for the patient a few days during the week at home. The patient is unable to get a full-time aide secondary to her not meeting criteria for Medicaid.  The only change to her discharge summary is that her Effexor XL was increased from 75 mg p.o. daily to 150 mg p.o. daily.  The patient was discharged home in stable medical condition.    ______________________________ Priscella Mann, MD   ______________________________ Leighton Roach Addeline Calarco, M.D.    AO/MEDQ  D:  07/23/2010  T:  07/24/2010  Job:  [id number]  Electronically Signed by Priscella Mann MD on 07/25/2010 06:49:35 PM Electronically Signed by Acquanetta Belling M.D. on 07/30/2010 12:39:16 PM

## 2010-07-30 NOTE — Discharge Summary (Signed)
[name], [name]                ACCOUNT NO.:  [id number]  MEDICAL RECORD NO.:  [id number]  LOCATION:  5522                         FACILITY:  [facility]  PHYSICIAN:  Santiago Bumpers. Gregor Dershem, M.D.DATE OF BIRTH:  1945-07-11  DATE OF ADMISSION:  07/14/2010 DATE OF DISCHARGE:                              DISCHARGE SUMMARY   ATTENDING PHYSICIAN:  Santiago Bumpers. Leveda Anna, MD  PRIMARY CARE PROVIDER:  [name], MD at [facility].  REASON FOR HOSPITALIZATION: 1. Chest pain. 2. Unable to care for herself at home.  DISCHARGE DIAGNOSES: 1. Chest pain secondary to anxiety. 2. Type 2 diabetes on insulin. 3. Hypertension. 4. History of stroke with left-sided neuropathy. 5. Major depressive disorder. 6. Hypothyroidism.  NEW MEDICATIONS: 1. Lantus 56 units subcutaneously q.a.m. 2. Lisinopril 5 mg p.o. daily. 3. Nortriptyline 25 mg p.o. at bedtime. 4. Effexor XR 75 mg p.o. daily.  CONTINUE HOME MEDICATIONS: 1. Plavix 75 mg p.o. daily. 2. Amlodipine 5 mg p.o. daily. 3. Baclofen 10 mg p.o. t.i.d. p.r.n. spasticity. 4. Levothyroxine 50 mcg p.o. q.a.m. 5. MiraLax 17 grams p.o. daily p.r.n. constipation. 6. Senokot 1 tablet p.o. at bedtime. 7. Simvastatin 20 mg p.o. daily. 8. Tylenol with Codeine 1-2 tablets p.o. q.4 h. p.r.n. pain. 9. Xalatan 0.005% ophthalmic, 1 drop both eyes at bedtime. 10.Zofran 4 mg p.o. q.4 h. p.r.n. nausea.  DISCONTINUED MEDICATIONS: 1. Aspirin 325 mg p.o. daily. 2. Duloxetine 30 mg p.o. q.a.m. 3. Humulin 70/30, 60 units q.a.m., 28 units q.p.m. 4. Lantus 26 units subcutaneously at bedtime.  CONSULTS:  None.  PROCEDURES:  Chest x-ray on admission showed no active cardiopulmonary disease.  PERTINENT LABS:  Cardiac enzymes negative x3.  TSH 1.209, hemoglobin A1c 6.9.  BRIEF HOSPITAL COURSE:  This is a 65 year old female status post right parietal stroke in February 2012, presenting with chest pain and inability to care for herself at home. 1. Chest pain  secondary to anxiety.  ACS ruled out with unchanged     serial EKGs x2 and negative cardiac enzymes x3.  Chest pain     resolved after initial presentation.  Likely secondary to anxiety     concerning social situation. 2. Inability to care for self at home.  The patient recently returned     to short-term skilled nursing facility following stroke.  Has been     at home for the past few weeks.  Lives with an elderly husband in     his 72s.  She and her husband are unable to care for the patient at     home since the patient is unable to ambulate well following her     stroke.  The patient's husband and the patient's daughter is     requesting long-term skilled nursing facility placement. 3. Type 2 diabetes on insulin.  The patient on unusual dose of Lantus     and 70/30 at home.  Insulin transitioned to Lantus only.  The     patient on 56 units of Lantus q.a.m. at the time of this dictation.     The patient started on low-dose lisinopril for renal protection. 4. Hypertension.  Controlled with Norvasc and lisinopril.5. History of stroke  with left-sided neuropathy.  Stable symptoms.  On     low dose aspirin and Plavix at home.  Due to increased bleeding     with no mortality benefit with both, aspirin was discontinued and     the patient was kept on Plavix only secondary to her having her     stroke while on full-dose aspirin.  Home Zocor continued.     Nortriptyline started for neuropathic pain from stroke.  The     patient reported improvement in symptoms.  Initially started on     Neurontin.  The patient did not tolerate this well causing     excessive sedation and malaise. 6. Major depressive disorder.  Understandably deteriorated based on     her current situation.  The patient expected to recuperate more     fully after her stroke and at this point that she will need to go     to a skilled nursing facility again.  Celexa changed to Effexor.     The patient tolerating medication well.   The patient was tearful     throughout her hospitalization but seemed more hopeful and less     tearful at the time of this dictation. 7. Hypothyroidism.  Stable.  TSH within normal limits.  Continued on     home levothyroxine.  DISPOSITION:  The patient's husband and the patient's daughters who live in [place] felt that the patient needs to go to a long-term skilled nursing facility.  The patient also feels that a skilled nursing facility would be more appropriate, although she expressed her desire wanting to be at home.  The patient understands that she and her husband are unable to care for the patient at home and the patient's current condition.  The patient not able to ambulate well and needs help going to the bathroom.  Social work was consulted.  Unfortunately based on the patient's Medicare program, the patient has run out of the number of days that she is given at a skilled nursing facility.  However, her husband has [employer].  Therefore, this is the patient's secondary insurance.  At the time of this dictation, social work was looking into possible skilled nursing facility placement options in light of the patient's current insurance status.  If the patient cannot go to a skilled nursing facility, the patient will be discharged to home with home health.  DISPOSITION:  The patient was discharged to skilled nursing facility or home with home health in stable medical condition.  FOLLOWUP DISCHARGE INSTRUCTIONS:  Please follow up with her PCP Dr. Alwyn Ren in 1-2 weeks following discharge.  FOLLOWUP ISSUES:  The patient's insulin regimen was changed during this hospitalization.  Recommend PCP followup of her diabetes to ensure adequate control of her diabetes.    ______________________________ Priscella Mann, MD   ______________________________ Santiago Bumpers Leveda Anna, M.D.    AO/MEDQ  D:  07/20/2010  T:  07/21/2010  Job:  [id number]  cc:   [name], MD  Electronically Signed by Priscella Mann MD on 07/25/2010 06:48:41 PM Electronically Signed by Doralee Albino M.D. on 07/30/2010 09:36:09 AM

## 2010-09-03 ENCOUNTER — Encounter: Payer: Self-pay | Admitting: Podiatry

## 2010-09-03 DIAGNOSIS — E119 Type 2 diabetes mellitus without complications: Secondary | ICD-10-CM | POA: Insufficient documentation

## 2010-10-08 ENCOUNTER — Emergency Department (HOSPITAL_COMMUNITY): Payer: Medicare Other

## 2010-10-08 ENCOUNTER — Observation Stay (HOSPITAL_COMMUNITY)
Admission: EM | Admit: 2010-10-08 | Discharge: 2010-10-09 | Disposition: A | Discharge status: Home / Self Care | Payer: Medicare Other | Source: Ambulatory Visit | Attending: Emergency Medicine | Admitting: Emergency Medicine

## 2010-10-08 DIAGNOSIS — Z8673 Personal history of transient ischemic attack (TIA), and cerebral infarction without residual deficits: Secondary | ICD-10-CM | POA: Insufficient documentation

## 2010-10-08 DIAGNOSIS — R112 Nausea with vomiting, unspecified: Principal | ICD-10-CM | POA: Insufficient documentation

## 2010-10-08 DIAGNOSIS — I10 Essential (primary) hypertension: Secondary | ICD-10-CM | POA: Insufficient documentation

## 2010-10-08 DIAGNOSIS — E785 Hyperlipidemia, unspecified: Secondary | ICD-10-CM | POA: Insufficient documentation

## 2010-10-08 DIAGNOSIS — E119 Type 2 diabetes mellitus without complications: Secondary | ICD-10-CM | POA: Insufficient documentation

## 2010-10-08 DIAGNOSIS — E039 Hypothyroidism, unspecified: Secondary | ICD-10-CM | POA: Insufficient documentation

## 2010-10-08 DIAGNOSIS — R109 Unspecified abdominal pain: Secondary | ICD-10-CM | POA: Insufficient documentation

## 2010-10-08 LAB — COMPREHENSIVE METABOLIC PANEL
AST: 16 U/L (ref 0–37)
Albumin: 4 g/dL (ref 3.5–5.2)
Alkaline Phosphatase: 108 U/L (ref 39–117)
BUN: 6 mg/dL (ref 6–23)
CO2: 26 mEq/L (ref 19–32)
Chloride: 103 mEq/L (ref 96–112)
GFR calc non Af Amer: >60 mL/min (ref >60)
Potassium: 3.3 mEq/L — ABNORMAL LOW (ref 3.5–5.1)
Total Bilirubin: 0.5 mg/dL (ref 0.3–1.2)

## 2010-10-08 LAB — DIFFERENTIAL
Basophils Absolute: 0 10*3/uL (ref 0.0–0.1)
Eosinophils Relative: 1 % (ref 0–5)
Lymphocytes Relative: 44 % (ref 12–46)
Lymphs Abs: 3.3 10*3/uL (ref 0.7–4.0)
Neutro Abs: 3.6 10*3/uL (ref 1.7–7.7)
Neutrophils Relative %: 48 % (ref 43–77)

## 2010-10-08 LAB — CBC
HCT: 39.5 % (ref 36.0–46.0)
MCV: 84.4 fL (ref 78.0–100.0)
RBC: 4.68 MIL/uL (ref 3.87–5.11)
RDW: 12.4 % (ref 11.5–15.5)
WBC: 7.4 10*3/uL (ref 4.0–10.5)

## 2010-10-08 LAB — URINALYSIS, ROUTINE W REFLEX MICROSCOPIC
Bilirubin Urine: NEGATIVE
Glucose, UA: 500 mg/dL — AB
Ketones, ur: 40 mg/dL — AB
Protein, ur: NEGATIVE mg/dL
pH: 7 (ref 5.0–8.0)

## 2010-10-08 LAB — LIPASE, BLOOD: Lipase: 8 U/L — ABNORMAL LOW (ref 11–59)

## 2010-10-09 ENCOUNTER — Observation Stay (HOSPITAL_COMMUNITY): Payer: Medicare Other

## 2010-10-09 LAB — GLUCOSE, CAPILLARY: Glucose-Capillary: 221 mg/dL — ABNORMAL HIGH (ref 70–99)

## 2010-10-22 ENCOUNTER — Inpatient Hospital Stay (HOSPITAL_COMMUNITY)
Admission: EM | Admit: 2010-10-22 | Discharge: 2010-10-25 | DRG: 074 | Disposition: A | Discharge status: Skilled Nursing Facility | Payer: Medicare Other | Source: Ambulatory Visit | Attending: Family Medicine | Admitting: Family Medicine

## 2010-10-22 DIAGNOSIS — N390 Urinary tract infection, site not specified: Secondary | ICD-10-CM | POA: Diagnosis present

## 2010-10-22 DIAGNOSIS — K3184 Gastroparesis: Secondary | ICD-10-CM | POA: Diagnosis present

## 2010-10-22 DIAGNOSIS — I10 Essential (primary) hypertension: Secondary | ICD-10-CM | POA: Diagnosis present

## 2010-10-22 DIAGNOSIS — E876 Hypokalemia: Secondary | ICD-10-CM | POA: Diagnosis present

## 2010-10-22 DIAGNOSIS — F3289 Other specified depressive episodes: Secondary | ICD-10-CM | POA: Diagnosis present

## 2010-10-22 DIAGNOSIS — E039 Hypothyroidism, unspecified: Secondary | ICD-10-CM | POA: Diagnosis present

## 2010-10-22 DIAGNOSIS — F329 Major depressive disorder, single episode, unspecified: Secondary | ICD-10-CM | POA: Diagnosis present

## 2010-10-22 DIAGNOSIS — E1149 Type 2 diabetes mellitus with other diabetic neurological complication: Principal | ICD-10-CM | POA: Diagnosis present

## 2010-10-22 DIAGNOSIS — K5900 Constipation, unspecified: Secondary | ICD-10-CM | POA: Diagnosis present

## 2010-10-22 DIAGNOSIS — I69969 Other paralytic syndrome following unspecified cerebrovascular disease affecting unspecified side: Secondary | ICD-10-CM

## 2010-10-22 LAB — DIFFERENTIAL
Basophils Absolute: 0 10*3/uL (ref 0.0–0.1)
Eosinophils Absolute: 0.1 10*3/uL (ref 0.0–0.7)
Eosinophils Relative: 1 % (ref 0–5)
Lymphocytes Relative: 49 % — ABNORMAL HIGH (ref 12–46)
Monocytes Absolute: 0.5 10*3/uL (ref 0.1–1.0)

## 2010-10-22 LAB — COMPREHENSIVE METABOLIC PANEL
CO2: 26 mEq/L (ref 19–32)
Calcium: 10.1 mg/dL (ref 8.4–10.5)
Creatinine, Ser: 0.65 mg/dL (ref 0.50–1.10)
GFR calc Af Amer: >60 mL/min (ref >60)
GFR calc non Af Amer: >60 mL/min (ref >60)
Glucose, Bld: 347 mg/dL — ABNORMAL HIGH (ref 70–99)
Total Protein: 7.3 g/dL (ref 6.0–8.3)

## 2010-10-22 LAB — CBC
HCT: 38.8 % (ref 36.0–46.0)
MCHC: 34.5 g/dL (ref 30.0–36.0)
MCV: 85.3 fL (ref 78.0–100.0)
Platelets: 305 10*3/uL (ref 150–400)
RDW: 12.4 % (ref 11.5–15.5)
WBC: 7.1 10*3/uL (ref 4.0–10.5)

## 2010-10-22 LAB — URINALYSIS, ROUTINE W REFLEX MICROSCOPIC
Bilirubin Urine: NEGATIVE
Ketones, ur: 15 mg/dL — AB
Nitrite: NEGATIVE
Protein, ur: NEGATIVE mg/dL
Urobilinogen, UA: 0.2 mg/dL (ref 0.0–1.0)

## 2010-10-22 LAB — POCT I-STAT TROPONIN I

## 2010-10-22 LAB — GLUCOSE, CAPILLARY: Glucose-Capillary: 205 mg/dL — ABNORMAL HIGH (ref 70–99)

## 2010-10-23 ENCOUNTER — Encounter: Payer: Self-pay | Admitting: Family Medicine

## 2010-10-23 LAB — GLUCOSE, CAPILLARY
Glucose-Capillary: 209 mg/dL — ABNORMAL HIGH (ref 70–99)
Glucose-Capillary: 192 mg/dL — ABNORMAL HIGH (ref 70–99)

## 2010-10-23 LAB — CK TOTAL AND CKMB (NOT AT ARMC)
CK, MB: 2.7 ng/mL (ref 0.3–4.0)
Relative Index: 2.5 (ref 0.0–2.5)
Total CK: 106 U/L (ref 7–177)

## 2010-10-23 LAB — BASIC METABOLIC PANEL
BUN: 5 mg/dL — ABNORMAL LOW (ref 6–23)
CO2: 23 mEq/L (ref 19–32)
Calcium: 9.4 mg/dL (ref 8.4–10.5)
Glucose, Bld: 215 mg/dL — ABNORMAL HIGH (ref 70–99)
Sodium: 141 mEq/L (ref 135–145)

## 2010-10-23 LAB — CARDIAC PANEL(CRET KIN+CKTOT+MB+TROPI)
CK, MB: 2.6 ng/mL (ref 0.3–4.0)
Total CK: 109 U/L (ref 7–177)
Total CK: 114 U/L (ref 7–177)

## 2010-10-23 LAB — HEMOGLOBIN A1C: Hgb A1c MFr Bld: 8 % — ABNORMAL HIGH (ref <5.7)

## 2010-10-23 LAB — CBC
Hemoglobin: 12.2 g/dL (ref 12.0–15.0)
MCH: 28.5 pg (ref 26.0–34.0)
MCHC: 33.4 g/dL (ref 30.0–36.0)
RDW: 12.5 % (ref 11.5–15.5)

## 2010-10-23 LAB — TROPONIN I: Troponin I: <0.3 ng/mL (ref <0.30)

## 2010-10-23 NOTE — H&P (Signed)
Family Medicine Teaching [facility] Admission History and Physical  Patient name: [name] Medical record number: [id number] Date of birth: March 20, 1945 Age: 65 y.o. Gender: female  Primary Care Provider: HOPPER,DAVID, MD  Chief Complaint: nausea and vomiting and difficulty caring for herself History of Present Illness: [name] is a 65 y.o. year old female with h/o parietal CVA, diabetes, hypertension, hypothyroidism, hyperlipidemia, depression and anxiety  presenting with nausea and vomiting in the last few days, unable to keep anything down. She reports non bloody, non bilious emesis. Reports some mild right upper quadrant discomfort. She has had a cholecystectomy in the past. She denies any constipation, diarrhea, dysuria or polyuria. She denies any back pain. She saw her primary care provider today and reported some mild chest pain, which she currently does not complain of.  She also reports not being able to take care of herself anymore. She had a CVA in February 2012 with some residual left sided weakness and pain inher left arm. She has very little mobility and can only transfer from the bed to the chair or to the bedside comode. Her husband is also unable to take care of her at home. She had home health PT/OT and nursing come for 16 weeks, but this stopped one week ago. Per last discharge summary, patient did not meet requirements for Skilled nursing facility.   Negative except per HPI  Past Medical History: 1. right parietal CVA in February 2012  2. Hypertension.   3. Type 2 diabetes.   4. Anxiety.   5. Depression.   6. Hyperlipidemia   7. Hypothyroidism.   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 Denies alcohol or illicit drug use. Lives with husband in [place]  Family History: Non  contributory Allergies: Allergies  Allergen Reactions  . Sulfa Antibiotics   - erythromycin: stomach cramps - flagyl: stomach cramps - ciprofloxacin: stomach cramps  Home Medications:  1. Venlafaxine 75mg  po qd 2. Clonazepam 0.5mg  po q8hrs as needed 3.Tizanidine 2mg  bid 4. Simvastatin 20mg  qd 5. miralax 17gm po daily 6. Lisinopril 5mg  qd 7. Levothyroxine po qd 8. Humulin R 20-50 u bid 9. plavix 75mg  qd 10. Amlodipine 5mg  po qd 11. Aleve 220mg  bid 12. xalatan 0.005% ophthalmic one drop in both eyes at bedtime  Physical Exam: Pulse: 110  Blood Pressure: 143/83 RR: 16   O2: 97 on RA Temp: 97.9  General: alert, cooperative, appears older than stated age and tearful at times HEENT: PERRLA and extra ocular movement intact Heart: S1, S2 normal, no murmur, rub or gallop, regular rate and rhythm Lungs: clear to auscultation, no wheezes or rales and unlabored breathing Abdomen: abdomen is soft without significant tenderness, masses, organomegaly or guarding Extremities: extremities normal, atraumatic, no cyanosis or edema Skin:no rashes, no wounds Neurology: left sided upper extremity weakness (3/5 strength in upper extremity)  due to pain in left shoulder, 4/5 strength in left lower extremity. Strength intact 5/5  in right lower and upper extremities. Sensation intact in upper and lower extremities b/l. Mild facial asymmetry.   Labs and Imaging: Lab Results  Component Value Date/Time   NA 139 10/22/2010  5:30 PM   K 4.0 10/22/2010  5:30 PM   CL 101 10/22/2010  5:30 PM   CO2 26 10/22/2010  5:30 PM   BUN 8 10/22/2010  5:30 PM   CREATININE 0.65 10/22/2010  5:30 PM   GLUCOSE 347* 10/22/2010  5:30 PM  TP: 7.3, alb: 4.0, AST: 23, ALT: 22, Alkphos: 114, tot bili: 0.5, Ca; 10.1 Lab Results  Component Value Date   WBC 7.1 10/22/2010   HGB 13.4 10/22/2010   HCT 38.8 10/22/2010   MCV 85.3 10/22/2010   PLT 305 10/22/2010   Point of care troponins: neg Lipase: 7 UA: 15 ketones, sg: 1.040,  glucose >1000, moderate blood, neg nitrites, neg leuks, Micro: many squamous epi, many bacteria    Assessment and Plan: [name] is a 65 y.o. year old female presenting with nausea and vomiting and need for assistance s/p CVA 1. Nausea and vomiting: unclear etiology. Could be due to diabetic gastroparesis or to atypical presentation of MI, given that she is a woman and had diabetes. We will cycle three sets of cardiac enzymes and get repeat EKG in morning. We will check TSH and A1C as well. Will start clear liquids and put on zofran 4mg  prn nausea. Patient's UA showed evidence of volume depletion, most likely secondary to poor po intake. Pt is s/p 1L bolus in ER. Will start maintenance NS at 125 cc per hour.  2. DM2: patient's glucose in the ED was elevated at 347. Pt takes humulin R 20-50 u bid but did not take it in the last few days given poor oral intake.   Will start patient on sliding scale during hospitalization. Obtain A1C.  3. Inability to care for herself at home: will consult social work to see for placement in SNF or renewal of home health PT/OT. Will also obtain PT/OT consult.  4. Hypertension: patient's blood pressures moderately elevated. Patient had not been taking medicine due to vomiting. Will restart her on home meds: amlodipine 5mg  po, lisinopril 5mg  po qd as long as she tolerates oral intake.  5. Hypothyroid: continue synthroid. Check TSH 6. Depreesion/anxiety: patient in distress aggravated by handicap and lack of autonomy. We will continue effexor 75mg  daily 7. Possible UTI: no nitrites or leuks and many squamous epithelial cells point to contamination, but we will follow up with urine culture.  8. FEN/GI: clear fluids, NS at 125cc/hr 3. Prophylaxis: heparin 5000u sq tid 4. Disposition: pending further improvement and placement.

## 2010-10-23 NOTE — H&P (Signed)
Family Medicine Teaching [facility] Admission History and Physical  Patient name: [name] Medical record number: [id number] Date of birth: 1945-12-18 Age: 65 y.o. Gender: female  Primary Care Provider: Janace Hoard, MD  Chief Complaint: Nausea/Vomiting and inability to care for herself  R2 Addendum  History of Present Illness: [name] is a 65 y.o. year old female with history of DMII and CVA who presents with nausea and vomiting.  Patient states she has had nausea and vomiting for the past couple of days.  She states she "just can't keep anything down."  Was seen by her PCP today and told him that she had some mild chest pain, however she denies this now.  She denies diarrhea, and bowel movements have been normal.  Her vomit has has been non bloody and non-bilious.  She does have some mild RUQ pain with this,  She is s/p cholecystectomy.  Her other complaint is that she is unable to care for herself at home since her CVA.  Her husband is also unable to take care of her at home.   Can not do daily ADL's as a result of deficit from CVA.  She states she was going to placed in a nursing home during her last admission but she was not eligible.  She did have a home health RN and PT/OT for 16 weeks but that stopped last week.  She is very anxious about going back home. Review Of Systems: Per HPI  Otherwise 12 point review of systems was performed and was unremarkable.  Physical Exam: Pulse: 110  Blood Pressure: 143/83 RR: 16   O2: 97.9% on RA Temp: 97.9  General: alert, cooperative and anxious appearing with episodes of tearfulness HEENT: PERRLA, extra ocular movement intact, sclera clear, anicteric, neck supple with midline trachea and poor dentition, no abscesses seen Heart: S1, S2 normal, no murmur, rub or gallop, regular rate and rhythm Lungs: clear to auscultation, no wheezes or rales and unlabored breathing Abdomen: abdomen is soft without significant tenderness, masses,  organomegaly or guarding Extremities: extremities normal, atraumatic, no cyanosis or edema Skin:no rashes, no wounds Neurology: Strength is 4/5 in LUE and LLE.  Sensation intact.    Labs and Imaging: CMP     Component Value Date/Time   NA 139 10/22/2010 1730   K 4.0 10/22/2010 1730   CL 101 10/22/2010 1730   CO2 26 10/22/2010 1730   GLUCOSE 347* 10/22/2010 1730   BUN 8 10/22/2010 1730   CREATININE 0.65 10/22/2010 1730   CALCIUM 10.1 10/22/2010 1730   PROT 7.3 10/22/2010 1730   ALBUMIN 4.0 10/22/2010 1730   AST 23 10/22/2010 1730   ALT 22 10/22/2010 1730   ALKPHOS 114 10/22/2010 1730   BILITOT 0.5 10/22/2010 1730   GFRNONAA >60 10/22/2010 1730   GFRAA >60 10/22/2010 1730     Lab Results  Component Value Date   WBC 7.1 10/22/2010   HGB 13.4 10/22/2010   HCT 38.8 10/22/2010   MCV 85.3 10/22/2010   PLT 305 10/22/2010   Urine dipstick shows positive for RBC's and positive for glucose.  Micro exam: Many epithelial cells, 0-3 WBC's per HPF, 0-3 RBC's per HPF and Many+ bacteria.  Lipase     Component Value Date/Time   LIPASE 7* 10/22/2010 1730      Assessment and Plan: [name] is a 65 y.o. year old female presenting with nausea and vomiting and inability to care for herself 1. Nausea and vomiting:  No clear cause of nausea and  vomiting.  Lipase normal.  Could be related to gastroparesis.  Will admit and monitor overnight and place on clear liquids for now.  Will use zofran for nausea control.  Could also be atypical presentation of MI given that she is female and diabetic.  Will cycle cardiac enzymes and check A1c.  Will also check a TSH given her hx of hypothyroidism 2. Inability to carry out ADL's:  Patient with an extreme amount of anxiety about not being able to care for herself and possible not being eligible for a SNF.  SW consulted in the ED and there was mention of DSS involvement from ED physician given that husband is neglecting to help take care of her.  Will also consult PT/OT for  further evaluation 3. DMII:  Glucose elevated at admission, although she has not taken her insulin x3 days 2/2 to not eating.  Will place on SSI and monitor.  Check A1C 4.  HTN:  BP mildly elevated at admission, she has not taken meds today 2/2 to inability to keep po down. Will continue her on her home PO meds here as long as she is tolerating PO 5. Depression:  Currently on Effexor, will continue. 6. Hypothyroidism:  Check TSH, continue levothyroxine. 7. ?UTI:  Patient with no symptoms of UTI.  Specimen looks to be contaminated.  Has been sent for cx and received Rocephin x1 in ED.  Will not continue at this time given lack of symptoms and afebrile. 8. FEN/GI: Clear liquids, NS @ 131ml/hr 9. Prophylaxis: Heparing 5000 units sq tid 10. Disposition: Pending improvement and eval for placement.

## 2010-10-24 ENCOUNTER — Inpatient Hospital Stay (HOSPITAL_COMMUNITY): Payer: Medicare Other

## 2010-10-24 DIAGNOSIS — K3184 Gastroparesis: Secondary | ICD-10-CM

## 2010-10-24 DIAGNOSIS — R112 Nausea with vomiting, unspecified: Secondary | ICD-10-CM

## 2010-10-24 LAB — CBC
Platelets: 272 10*3/uL (ref 150–400)
RDW: 12.4 % (ref 11.5–15.5)
WBC: 5.5 10*3/uL (ref 4.0–10.5)

## 2010-10-24 LAB — URINE CULTURE
Colony Count: 30000
Culture  Setup Time: 201209110437

## 2010-10-24 LAB — BASIC METABOLIC PANEL
Calcium: 9.6 mg/dL (ref 8.4–10.5)
GFR calc non Af Amer: >60 mL/min (ref >60)
Glucose, Bld: 192 mg/dL — ABNORMAL HIGH (ref 70–99)
Sodium: 144 mEq/L (ref 135–145)

## 2010-10-24 LAB — GLUCOSE, CAPILLARY

## 2010-10-24 MED ORDER — TECHNETIUM TC 99M SULFUR COLLOID: 2.0000 | Freq: Once | INTRAVENOUS | Status: DC | PRN

## 2010-10-24 MED ORDER — TECHNETIUM TC 99M SULFUR COLLOID
2.0000 | Freq: Once | INTRAVENOUS | Status: AC | PRN
  Administered 2010-10-24: 2 via ORAL

## 2010-10-25 LAB — BASIC METABOLIC PANEL
CO2: 25 mEq/L (ref 19–32)
Calcium: 9.5 mg/dL (ref 8.4–10.5)
Chloride: 107 mEq/L (ref 96–112)
Glucose, Bld: 197 mg/dL — ABNORMAL HIGH (ref 70–99)
Potassium: 3.2 mEq/L — ABNORMAL LOW (ref 3.5–5.1)
Sodium: 141 mEq/L (ref 135–145)

## 2010-10-25 LAB — CBC
Hemoglobin: 11.4 g/dL — ABNORMAL LOW (ref 12.0–15.0)
MCH: 28.4 pg (ref 26.0–34.0)
RBC: 4.01 MIL/uL (ref 3.87–5.11)
WBC: 4.7 10*3/uL (ref 4.0–10.5)

## 2010-10-25 LAB — GLUCOSE, CAPILLARY: Glucose-Capillary: 178 mg/dL — ABNORMAL HIGH (ref 70–99)

## 2010-11-12 NOTE — H&P (Signed)
NAMEBRYN, SALINE                ACCOUNT NO.:  [id number]  MEDICAL RECORD NO.:  [id number]  LOCATION:                                 FACILITY:  PHYSICIAN:  Santiago Bumpers. Hensel, M.D.DATE OF BIRTH:  March 28, 1945  DATE OF ADMISSION: DATE OF DISCHARGE:                             HISTORY & PHYSICAL   PRIMARY CARE PROVIDER:  Sandria Bales. Alwyn Ren, MD  CHIEF COMPLAINTS:  Nausea, vomiting, difficulty caring for herself  HISTORY OF PRESENT ILLNESS:  Ms. Vanegas is a 65 year old female with history of parietal CVA, diabetes, hypertension, hypothyroidism, hyperlipidemia, depression, anxiety, presenting with nausea and vomiting over the last few days, unable to keep anything down.  She reports nonbloody, nonbilious emesis.  Reports some mild right upper quadrant discomfort.  She has had a cholecystectomy in the past.  She denies any constipation, diarrhea, dysuria, or polyuria.  She denies any back pain. She saw her primary care provider today and reported some mild chest pain, which she currently does not complain of.  She also reports not being able to take care of herself anymore.  She had a CVA in February 2012 with some residual left-sided weakness and pain in her left arm. She has very little mobility and can only transfer from the bed to the chair to the bedside commode.  Her husband is also unable to take care of her at home.  She has home health, PT, OT and nursing home for 16 weeks but this stopped one week ago.  Per last discharge summary, the patient did not meet requirements for skilled nursing facility.  REVIEW OF SYSTEMS:  Negative except for HPI.  PAST MEDICAL HISTORY: 1. 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 2. Hypertension. 3. Type 2 diabetes. 4. Anxiety. 5. Depression. 6. Hyperlipidemia. 7. Hypothyroidism.  PAST SURGICAL HISTORY: 1. Bilateral rotator cuff surgery. 2. 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 3. Complete hysterectomy. 4. Thyroid  surgery. 5. Cataract surgery.  SOCIAL HISTORY:  Quit smoking in 1985.  Denies alcohol or illicit drug use.  Lives with her husband in [place].  FAMILY HISTORY:  Noncontributory.  ALLERGIES: 1. SULFA. 2. ERYTHROMYCIN CAUSES STOMACH CRAMPS. 3. FLAGYL CAUSES STOMACH CRAMPS. 4. CIPROFLOXACIN CAUSES STOMACH CRAMPS.  HOME MEDICATIONS: 1. Venlafaxine 75 mg p.o. daily. 2. Clonazepam 0.5 mg p.o. q.8 as needed. 3. Tizanidine 2 mg b.i.d. 4. Simvastatin 20 mg daily. 5. MiraLax 17 grams p.o. daily. 6. Lisinopril 5 mg daily. 7. Levothyroxine 50 mcg p.o. daily. 8. Humulin R 20-50 units b.i.d. 9. Plavix 75 mg daily. 10.Amlodipine 5 mg p.o. daily. 11.Aleve 220 mg b.i.d. 12.Xalatan 0.005% ophthalmic 1 drop in both eyes at bedtime.  PHYSICAL EXAMINATION:  VITAL SIGNS:  Heart rate 110, blood pressure 143/83, respiratory rate 16, O2 97% on room air, temperature 97.9. GENERAL:  Alert, cooperative, appears older than stated age and is tearful at times. HEENT:  Pupils are equal, round, reactive to light and accommodation. Extraocular movements are intact. HEART:  S1, S2 normal.  No murmurs, rubs, or gallops.  Regular rate and rhythm. LUNGS:  Clear to auscultation.  No wheezes, no rales, and unlabored breathing. ABDOMEN:  Soft without significant tenderness, masses, organomegaly,  or guarding. EXTREMITIES:  Normal, atraumatic.  No cyanosis or edema. SKIN:  No rashes.  No wounds. NEUROLOGIC:  Left-sided upper extremity weakness, 3/5 strength in upper extremity due to pain in the left shoulder, 4/5 strength the left lower extremity, strength intact 5/5 in right lower and upper extremities. Sensation intact in upper and lower extremities bilaterally.  Mild facial asymmetry.  LABS AND IMAGING:  Sodium 139, potassium 4.0, chloride 101, CO2 of 26, BUN 8, creatinine 0.65, glucose 347, total protein 7.3, albumin 4, AST 23, ALT 22, alk phos 114, total bili 0.5, calcium 10.1.  WBC 7.1, hemoglobin  13.4, hematocrit 38.8, MCV 85.3, platelets 305.  Point of care troponin negative.  Lipase 7.  UA; 15 ketones, specific gravity 1.040, glucose greater than 1000, moderate blood, negative nitrites, negative leukocyte esterase.  Microanalysis; many squamous epi and many bacteria.  ASSESSMENT AND PLAN:  Ms. Pieri is a 65 year old female presenting with nausea, vomiting, and need for assistance status post CVA. 1. Nausea and vomiting.  Unclear etiology, could be due to diabetic     gastroparesis, or to atypical presentation of myocardial infarction     given that she is a woman that has diabetes.  We will cycle 3 sets     of cardiac enzymes and repeat EKG in the morning.  We will check     TSH and A1c for risk stratification as well.  We will start clear     liquids and put on Zofran 4 mg for nausea.  The patient's UA showed     evidence of volume depletion, most likely secondary to poor p.o.     intake.  The patient is status post 1 liter bolus in the ER.  We     will start maintenance saline at 125 mL per hour. 2. Diabetes.  The patient's glucose in the ED was elevated at 347.     The patient takes Humulin R 20-50 units b.i.d., but did not take in     the last few days given her poor oral intake.  We will start the     patient on sliding scale during her hospitalization.  We will also     obtain an A1c. 3. Inability to care for herself at home.  We will consult Social Work     to see for placement in Skilled Nursing Facility or renewal of home     health, Physical Therapy, Occupational Therapy.  We will also     consult PT/OT. 4. Hypertension.  The patient's blood pressure is mildly elevated.     The patient had not been taking medicine due to vomiting.  We will     restart her on home medications, amlodipine 5, lisinopril 5, as     long as she can tolerate oral intake. 5. Hypothyroidism.  Continue Synthroid and check a TSH. 6. Depression and anxiety.  The patient in distress, aggravated  by     handicap and the lack of autonomy.  We will continue Effexor 75 mg     daily. 7. Possible urinary tract infection.  No nitrates or leukocyte     esterase, and many squamous epithelial cells point to     contamination, but we will follow up with the urine culture. 8. FEN/GI:  Clear fluids, normal saline at 125 mL per hour. 9. Prophylaxis:  Heparin 5000 units subcu t.i.d.  DISPOSITION:  Pending further improvement and placement.    ______________________________ Marena Chancy, MD   ______________________________ Santiago Bumpers.  Leveda Anna, M.D.    SL/MEDQ  D:  10/23/2010  T:  10/23/2010  Job:  [id number]  Electronically Signed by Marena Chancy MD on 11/06/2010 03:15:07 PM Electronically Signed by Doralee Albino M.D. on 11/12/2010 10:06:09 AM

## 2010-11-12 NOTE — Discharge Summary (Signed)
[name], [name]                ACCOUNT NO.:  [id number]  MEDICAL RECORD NO.:  [id number]  LOCATION:  [facility]                         FACILITY:  [facility]  PHYSICIAN:  Santiago Bumpers. Ordean Fouts, M.D.DATE OF BIRTH:  1945/06/01  DATE OF ADMISSION:  10/22/2010 DATE OF DISCHARGE:  10/25/2010                              DISCHARGE SUMMARY   PRIMARY CARE PROVIDER:  [name], MD at [facility].  DISCHARGE DIAGNOSES: 1. Gastroparesis. 2. Diabetes. 3. Hypertension. 4. Hypothyroidism. 5. Depression. 6. Possible urinary tract infection.  DISCHARGE MEDICATIONS:  New medications on discharge; 1. Tylenol 325 mg takes 650 mg by mouth every 6 hours as needed. 2. Erythromycin base 250 mg by mouth 3 times a day. 3. NovoLog 1 unit for every 50 blood glucose over 250 institutions     moderate coverage sliding scale. 4. Lantus 7 units subcutaneously at bedtime. 5. Lisinopril 10 mg by mouth daily. 6. Zofran 4 mg by mouth every 6 hours as needed p.r.n. nausea. 7. Insulin needles for the bile. 8. Insulin needles to fit FlexPen. 9. Insulin syringes.  Same medications on discharge; 1. Aleve 220 mg 1 tablet by mouth twice daily. 2. Amlodipine 5 mg 1 tablet by mouth daily. 3. Clonazepam 0.5 mg 1 tablet by mouth every 8 hours as needed for     anxiety. 4. Plavix 75 mg 1 tablet by mouth daily with meals. 5. Levothyroxine 50 mcg 1 tablet by mouth every morning. 6. MiraLax by mouth daily as needed. 7. Simvastatin 20 mg 1 tablet by mouth daily. 8. Tizanidine 2 mg 1 tablet by mouth twice daily. 9. Venlafaxine 75 mg 1 tablet by mouth daily. 10.Xalatan 0.005% ophthalmic 1 drop in both eyes daily at bedtime.  Medications stopped on discharge; 1. Lisinopril 5 mg by mouth daily. 2. Humulin R 20-50 units subcutaneously twice daily.  CONSULTS:  Social Work for placement.  PROCEDURES:  Gastric emptying study showing 120 minutes, 45% of the radiopharmaceutical remains in the stomach.  Normal is less  than 30%. Gastric emptying study showing delayed gastric emptying.  LABORATORY DATA:  Complete metabolic panel on admission, sodium was 139, potassium 4.0, chloride 101, CO2 of 26, glucose 347, BUN 8, creatinine 0.65, bilirubin 0.5, alk phos 114, AST 23, ALT 22, total protein 7.3, albumin 4, calcium 10.1, and lipase 7.  CBC on admit, WBC 7.1, hemoglobin 13.4, hematocrit 38.8, and platelets 305.  Urinalysis greater than 1000 glucose, 15 ketones, negative nitrates, and negative leuks. Urine microscopy, many bacteria and many squamous epi, 0-2 wbc's, and 0- 2 rbc's.  Urine culture 30,000 colonies of multiple bacterial morphotypes present non-predominant.  Cardiac enzymes negative x3.  TSH 3.761 and hemoglobin 8.  CBC on discharge; WBC 4.7, RBC 4.01, hemoglobin 11.4, hematocrit 34, and platelet count 253.  BMET; sodium of 141, potassium 3.2, chloride 107, CO2 of 25, glucose 197, BUN 4, creatinine 0.52, and calcium 9.5.  BRIEF HOSPITAL COURSE:  A 65 year old female with history of CVA with residual left hemiparesis, hypertension, hypothyroidism, diabetes, who presented with nausea and vomiting for multiple days and need for placement in a safe environment. 1. Gastroparesis.  The patient had nausea and vomiting associated with  some mild right upper quadrant pain.  The patient had     cholecystectomy in the past; however, so we were not concerned for any     gallbladder involvement.  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  The patient was started on Reglan, reported improved     appetite and reduced nausea and resolved vomiting.  She was also on     Zofran p.r.n.  Given Reglan's side effects of tardive     dyskinesia, we decided to stop it on discharge and start     erythromycin 250 mg t.i.d. 2. Need for placement on arrival to the emergency department.  The     patient's daughters were concerned that the  patient cannot care for     herself given her limited mobility status post her cerebrovascular     accident in February 2012.  They also had concern that the patient's husband could     not care for her.  The clinical social worker was consulted     and placement was found for the patient at [facility]. 3. Diabetes.  The patient's glucose on admission was elevated at 347.     The patient was on Humulin R 20 and 50 units t.i.d. with meals, but     had not been taking and given nausea and vomiting.  In the     hospital, the patient was started on insulin sliding scale and then     started on Lantus 5 units.  On the day prior to discharge, she     received 5 units of Lantus and 5 units of sliding scale, CBG of 179     and 198.  We decided to increase Lantus 7.5 units on discharge with     NovoLog sliding scale for meals coverage. 4. Hypertension.  The patient's blood pressures were elevated 150     systolic throughout the hospitalization.  The patient was initially     started on home meds Lisinopril 5 mg and Norvasc 5 mg.  We     increased lisinopril to 10 mg daily.  Blood pressure on discharge     was still elevated at 159/81. 5. Hypothyroidism.  TSH was within normal limits and the patient was     kept on her Synthroid 50 mcg. 6. Depression.  The patient initially was tearful, but as her nausea and vomiting     resolved, she started feeling better.  The patient was put on her     home dose of Effexor 75 mg and was discharged stable. 7. Possible urinary tract infection.  The patient's urinalysis showed     moderate blood, greater than 1000 glucose and many epis and     bacteria and her urine culture showed 30,000 colonies of various     morphotypes.  The patient was given 1 dose of Rocephin in the ER.     It was thought however that she had a contaminated specimen.  Followup: 1. urinalysis, if symptoms of fever or increased white count. 2. Potassium on discharge was 2.2 and the  patient was given 40 mEq     before transfer. 3.Blood pressure.  The patient's lisinopril was increased shortly     before discharge.  We will need to follow up on her blood     pressures. 4.The patient complained of some tooth pain that had been chronically  present.  She will need to followup with dentist.  DISCHARGE INSTRUCTIONS:  The patient to follow diabetic diet to eat small meals.  At that time given her gastroparesis and to keep good control of her diabetes, so that her gastroparesis does not worsen.  DISCHARGE CONDITION:  The patient was discharged to [facility] in stable medical condition.    ______________________________ Marena Chancy, MD   ______________________________ Santiago Bumpers. Leveda Anna, M.D.    SL/MEDQ  D:  10/25/2010  T:  10/25/2010  Job:  [id number]  cc:   [name], MD [facility]  Electronically Signed by Marena Chancy MD on 11/06/2010 03:14:24 PM Electronically Signed by Doralee Albino M.D. on 11/12/2010 10:06:16 AM

## 2011-03-02 ENCOUNTER — Ambulatory Visit (INDEPENDENT_AMBULATORY_CARE_PROVIDER_SITE_OTHER): Payer: Medicare Other

## 2011-03-02 DIAGNOSIS — IMO0001 Reserved for inherently not codable concepts without codable children: Secondary | ICD-10-CM

## 2011-03-02 DIAGNOSIS — F411 Generalized anxiety disorder: Secondary | ICD-10-CM

## 2011-03-02 DIAGNOSIS — I10 Essential (primary) hypertension: Secondary | ICD-10-CM

## 2011-03-02 DIAGNOSIS — I69998 Other sequelae following unspecified cerebrovascular disease: Secondary | ICD-10-CM

## 2011-03-02 DIAGNOSIS — E789 Disorder of lipoprotein metabolism, unspecified: Secondary | ICD-10-CM

## 2011-03-13 ENCOUNTER — Telehealth: Payer: Self-pay | Admitting: *Deleted

## 2011-03-13 NOTE — Telephone Encounter (Signed)
.  [facility]  TAMERA HAYES WITH [facility] STATES PATIENT FELL (SLID OUT OF WHEELCHAIR)  SHE IS A LITTLE SORE  SHE FULLY PARTICIPATED WITH PT YESTERDAY.  WANTED DR. HOPPER TO KNOW  CB # [phone]

## 2011-03-15 NOTE — Telephone Encounter (Signed)
Call noted.

## 2011-03-21 ENCOUNTER — Telehealth: Payer: Self-pay

## 2011-03-21 NOTE — Telephone Encounter (Signed)
.  [facility]  Pills were spilled everywhere.  Three bottles completely empty.  Eleven syringes on the floor.  Three remaining, should NOT have been any left. Sugar had not been tested since last time Nurse was there last week.  Agency contacting DSS, it is too high of a liability for [facility] to manage.  Today, patient met the nurse in a tee shirt and blanket. Please call [employer] [phone]

## 2011-03-23 NOTE — Telephone Encounter (Signed)
Please pull chart and route message to provider that cares for patient.

## 2011-03-26 ENCOUNTER — Telehealth: Payer: Self-pay

## 2011-03-26 NOTE — Telephone Encounter (Signed)
LMOM to CB. 

## 2011-03-26 NOTE — Telephone Encounter (Signed)
Darryl CB and stated that they are going to try to get pt back into care facility, but MD needs to fill out an FL2 form which specifies what level of care if needed. He will bring form by tomorrow to 102.

## 2011-03-26 NOTE — Telephone Encounter (Signed)
[name] from [employer]. Adult protective services calling back concerning pt. Please call back at [phone]

## 2011-03-27 NOTE — Telephone Encounter (Signed)
Chart was forwarded to Dr Alwyn Ren and I contacted Adult Protective Services at his request.

## 2011-03-27 NOTE — Telephone Encounter (Signed)
Has this been forwarded after chart pulled?

## 2011-04-01 ENCOUNTER — Observation Stay (HOSPITAL_COMMUNITY)
Admission: EM | Admit: 2011-04-01 | Discharge: 2011-04-04 | Disposition: A | Discharge status: Skilled Nursing Facility | Payer: Medicare Other | Source: Ambulatory Visit | Attending: Family Medicine | Admitting: Family Medicine

## 2011-04-01 ENCOUNTER — Encounter (HOSPITAL_COMMUNITY): Payer: Self-pay | Admitting: *Deleted

## 2011-04-01 ENCOUNTER — Other Ambulatory Visit: Payer: Self-pay

## 2011-04-01 ENCOUNTER — Emergency Department (HOSPITAL_COMMUNITY): Payer: Medicare Other

## 2011-04-01 DIAGNOSIS — I2510 Atherosclerotic heart disease of native coronary artery without angina pectoris: Secondary | ICD-10-CM

## 2011-04-01 DIAGNOSIS — I6990 Unspecified sequelae of unspecified cerebrovascular disease: Secondary | ICD-10-CM

## 2011-04-01 DIAGNOSIS — R0789 Other chest pain: Principal | ICD-10-CM | POA: Insufficient documentation

## 2011-04-01 DIAGNOSIS — I6930 Unspecified sequelae of cerebral infarction: Secondary | ICD-10-CM

## 2011-04-01 DIAGNOSIS — E119 Type 2 diabetes mellitus without complications: Secondary | ICD-10-CM | POA: Diagnosis present

## 2011-04-01 DIAGNOSIS — E785 Hyperlipidemia, unspecified: Secondary | ICD-10-CM | POA: Diagnosis present

## 2011-04-01 DIAGNOSIS — E039 Hypothyroidism, unspecified: Secondary | ICD-10-CM | POA: Diagnosis present

## 2011-04-01 DIAGNOSIS — E7800 Pure hypercholesterolemia, unspecified: Secondary | ICD-10-CM | POA: Insufficient documentation

## 2011-04-01 DIAGNOSIS — R072 Precordial pain: Secondary | ICD-10-CM | POA: Diagnosis present

## 2011-04-01 DIAGNOSIS — I10 Essential (primary) hypertension: Secondary | ICD-10-CM | POA: Diagnosis present

## 2011-04-01 DIAGNOSIS — I69959 Hemiplegia and hemiparesis following unspecified cerebrovascular disease affecting unspecified side: Secondary | ICD-10-CM | POA: Insufficient documentation

## 2011-04-01 HISTORY — DX: Essential (primary) hypertension: I10

## 2011-04-01 HISTORY — DX: Cerebral infarction, unspecified: I63.9

## 2011-04-01 HISTORY — DX: Anxiety disorder, unspecified: F41.9

## 2011-04-01 HISTORY — DX: Pure hypercholesterolemia, unspecified: E78.00

## 2011-04-01 HISTORY — DX: Disorder of thyroid, unspecified: E07.9

## 2011-04-01 HISTORY — DX: Personal history of other diseases of the digestive system: Z87.19

## 2011-04-01 HISTORY — DX: Atherosclerotic heart disease of native coronary artery without angina pectoris: I25.10

## 2011-04-01 HISTORY — DX: Unspecified osteoarthritis, unspecified site: M19.90

## 2011-04-01 HISTORY — DX: Shortness of breath: R06.02

## 2011-04-01 HISTORY — DX: Migraine, unspecified, not intractable, without status migrainosus: G43.909

## 2011-04-01 HISTORY — DX: Angina pectoris, unspecified: I20.9

## 2011-04-01 HISTORY — DX: Chronic obstructive pulmonary disease, unspecified: J44.9

## 2011-04-01 HISTORY — DX: Sleep apnea, unspecified: G47.30

## 2011-04-01 HISTORY — DX: Gastric ulcer, unspecified as acute or chronic, without hemorrhage or perforation: K25.9

## 2011-04-01 HISTORY — DX: Hypothyroidism, unspecified: E03.9

## 2011-04-01 LAB — COMPREHENSIVE METABOLIC PANEL
AST: 12 U/L (ref 0–37)
Albumin: 3.4 g/dL — ABNORMAL LOW (ref 3.5–5.2)
BUN: 9 mg/dL (ref 6–23)
Chloride: 104 mEq/L (ref 96–112)
GFR calc non Af Amer: >90 mL/min (ref >90)
Glucose, Bld: 259 mg/dL — ABNORMAL HIGH (ref 70–99)
Potassium: 3.7 mEq/L (ref 3.5–5.1)
Total Protein: 6.5 g/dL (ref 6.0–8.3)

## 2011-04-01 LAB — GLUCOSE, CAPILLARY: Glucose-Capillary: 242 mg/dL — ABNORMAL HIGH (ref 70–99)

## 2011-04-01 LAB — CBC
HCT: 37.7 % (ref 36.0–46.0)
Hemoglobin: 12.7 g/dL (ref 12.0–15.0)
MCH: 28.4 pg (ref 26.0–34.0)
MCHC: 33 g/dL (ref 30.0–36.0)
MCV: 86.2 fL (ref 78.0–100.0)
Platelets: 250 10*3/uL (ref 150–400)
RBC: 4.39 MIL/uL (ref 3.87–5.11)
RDW: 12.2 % (ref 11.5–15.5)
WBC: 6.3 10*3/uL (ref 4.0–10.5)

## 2011-04-01 LAB — CARDIAC PANEL(CRET KIN+CKTOT+MB+TROPI)
CK, MB: 2.1 ng/mL (ref 0.3–4.0)
Relative Index: INVALID (ref 0.0–2.5)
Troponin I: <0.3 ng/mL (ref <0.30)

## 2011-04-01 LAB — POCT I-STAT TROPONIN I

## 2011-04-01 MED ORDER — CLOPIDOGREL BISULFATE 75 MG PO TABS
75.0000 mg | ORAL_TABLET | Freq: Every day | ORAL | Status: DC
  Administered 2011-04-02: 75 mg via ORAL
  Filled 2011-04-01: qty 1

## 2011-04-01 MED ORDER — INSULIN ASPART PROT & ASPART (70-30 MIX) 100 UNIT/ML ~~LOC~~ SUSP
20.0000 [IU] | Freq: Two times a day (BID) | SUBCUTANEOUS | Status: DC
  Administered 2011-04-02 – 2011-04-04 (×5): 20 [IU] via SUBCUTANEOUS
  Filled 2011-04-01: qty 3

## 2011-04-01 MED ORDER — CLONAZEPAM 0.5 MG PO TABS: 0.5000 mg | ORAL_TABLET | Freq: Three times a day (TID) | ORAL | Status: DC | PRN

## 2011-04-01 MED ORDER — LEVOTHYROXINE SODIUM 50 MCG PO TABS
50.0000 ug | ORAL_TABLET | Freq: Every day | ORAL | Status: DC
  Administered 2011-04-02 – 2011-04-04 (×3): 50 ug via ORAL
  Filled 2011-04-01 (×4): qty 1

## 2011-04-01 MED ORDER — ACETAMINOPHEN 325 MG PO TABS
650.0000 mg | ORAL_TABLET | Freq: Four times a day (QID) | ORAL | Status: DC | PRN
  Administered 2011-04-03: 650 mg via ORAL
  Filled 2011-04-01: qty 2

## 2011-04-01 MED ORDER — LATANOPROST 0.005 % OP SOLN
1.0000 [drp] | Freq: Every day | OPHTHALMIC | Status: DC
  Administered 2011-04-01 – 2011-04-03 (×3): 1 [drp] via OPHTHALMIC
  Filled 2011-04-01: qty 2.5

## 2011-04-01 MED ORDER — SODIUM CHLORIDE 0.9 % IV SOLN
INTRAVENOUS | Status: AC
  Administered 2011-04-01: via INTRAVENOUS

## 2011-04-01 MED ORDER — ENOXAPARIN SODIUM 30 MG/0.3ML ~~LOC~~ SOLN
30.0000 mg | SUBCUTANEOUS | Status: DC
  Administered 2011-04-01 – 2011-04-03 (×3): 30 mg via SUBCUTANEOUS
  Filled 2011-04-01 (×4): qty 0.3

## 2011-04-01 MED ORDER — SODIUM CHLORIDE 0.9 % IJ SOLN
3.0000 mL | Freq: Two times a day (BID) | INTRAMUSCULAR | Status: DC
  Administered 2011-04-02 – 2011-04-04 (×4): 3 mL via INTRAVENOUS

## 2011-04-01 MED ORDER — AMLODIPINE BESYLATE 5 MG PO TABS
5.0000 mg | ORAL_TABLET | Freq: Every day | ORAL | Status: DC
  Administered 2011-04-02 – 2011-04-04 (×3): 5 mg via ORAL
  Filled 2011-04-01 (×3): qty 1

## 2011-04-01 MED ORDER — ASPIRIN EC 81 MG PO TBEC
81.0000 mg | DELAYED_RELEASE_TABLET | Freq: Every day | ORAL | Status: DC
Start: 2011-04-02 — End: 2011-04-04
  Administered 2011-04-02 – 2011-04-04 (×3): 81 mg via ORAL
  Filled 2011-04-01 (×3): qty 1

## 2011-04-01 MED ORDER — CITALOPRAM HYDROBROMIDE 20 MG PO TABS
20.0000 mg | ORAL_TABLET | Freq: Every day | ORAL | Status: DC
  Administered 2011-04-02 – 2011-04-04 (×3): 20 mg via ORAL
  Filled 2011-04-01 (×3): qty 1

## 2011-04-01 MED ORDER — ACETAMINOPHEN 650 MG RE SUPP: 650.0000 mg | Freq: Four times a day (QID) | RECTAL | Status: DC | PRN

## 2011-04-01 MED ORDER — ONDANSETRON HCL 4 MG/2ML IJ SOLN: 4.0000 mg | Freq: Four times a day (QID) | INTRAMUSCULAR | Status: DC | PRN

## 2011-04-01 MED ORDER — INSULIN ASPART 100 UNIT/ML ~~LOC~~ SOLN
0.0000 [IU] | Freq: Three times a day (TID) | SUBCUTANEOUS | Status: DC
  Administered 2011-04-02: 3 [IU] via SUBCUTANEOUS
  Administered 2011-04-02: 2 [IU] via SUBCUTANEOUS
  Administered 2011-04-02: 3 [IU] via SUBCUTANEOUS
  Administered 2011-04-03: 1 [IU] via SUBCUTANEOUS
  Administered 2011-04-03 – 2011-04-04 (×4): 2 [IU] via SUBCUTANEOUS

## 2011-04-01 MED ORDER — INSULIN ASPART 100 UNIT/ML ~~LOC~~ SOLN
0.0000 [IU] | Freq: Every day | SUBCUTANEOUS | Status: DC
  Administered 2011-04-01: 2 [IU] via SUBCUTANEOUS
  Filled 2011-04-01: qty 3

## 2011-04-01 MED ORDER — NITROGLYCERIN 2 % TD OINT: 1.0000 [in_us] | TOPICAL_OINTMENT | Freq: Four times a day (QID) | TRANSDERMAL | Status: DC

## 2011-04-01 MED ORDER — TIZANIDINE HCL 4 MG PO TABS
4.0000 mg | ORAL_TABLET | Freq: Three times a day (TID) | ORAL | Status: DC | PRN
  Administered 2011-04-02: 8 mg via ORAL
  Filled 2011-04-01 (×2): qty 2

## 2011-04-01 MED ORDER — ONDANSETRON HCL 4 MG PO TABS: 4.0000 mg | ORAL_TABLET | Freq: Four times a day (QID) | ORAL | Status: DC | PRN

## 2011-04-01 NOTE — H&P (Signed)
PCP:   Janace Hoard, MD, MD   Chief Complaint:  Chest and abdominal pain  HPI: Patient is a 66 year old Afro-American female past question diabetes mellitus type 2, hypertension and hypothyroidism who yesterday started complaining of severe pain which she was not able to quantify in terms of type of pain but she said it was just quite painful and described it as someone had drawn and asked over her chest going down to her abdomen and pain was all across the fracture. It was coming and going and continued into today so she came to the emergency room for evaluation. She receives a nitroglycerin and morphine which seemed to make the pain go better. No shortness of breath or diaphoresis. No previous episodes of this. Her EKG showed nothing acute echocardiogram are performed normal. Her vital signs are stable and it was felt best that she come in for observation. [facility] were called for admission  Review of Systems:  I saw the patient emergency room, she was doing okay. She denies any headaches, vision changes, dysphasia, current chest pain, palpitations or shortness breath, wheeze, cough, abdominal pain currently, hematuria, dysuria, constipation, diarrhea, focal extremity numbness weakness or pain. Review systems otherwise negative  Past Medical History: Past Medical History  Diagnosis Date  . Diabetes mellitus   . Hypertension   . Thyroid disease   . Stroke     left side deficits  . Hypercholesteremia   . Coronary artery disease    No past surgical history on file.  Medications: Prior to Admission medications   Medication Sig Start Date End Date Taking? Authorizing Provider  amLODipine (NORVASC) 5 MG tablet Take 5 mg by mouth daily.   Yes Historical Provider, MD  aspirin EC 81 MG tablet Take 81 mg by mouth daily.   Yes Historical Provider, MD  citalopram (CELEXA) 20 MG tablet Take 20 mg by mouth daily.   Yes Historical Provider, MD  clonazePAM (KLONOPIN) 0.5 MG tablet Take 0.5  mg by mouth 3 (three) times daily as needed. For anxiety   Yes Historical Provider, MD  clopidogrel (PLAVIX) 75 MG tablet Take 75 mg by mouth daily.   Yes Historical Provider, MD  insulin aspart protamine-insulin aspart (NOVOLOG 70/30) (70-30) 100 UNIT/ML injection Inject 20 Units into the skin 2 (two) times daily with a meal.   Yes Historical Provider, MD  latanoprost (XALATAN) 0.005 % ophthalmic solution Place 1 drop into both eyes at bedtime.   Yes Historical Provider, MD  levothyroxine (SYNTHROID, LEVOTHROID) 50 MCG tablet Take 50 mcg by mouth daily.   Yes Historical Provider, MD  tiZANidine (ZANAFLEX) 4 MG tablet Take 4-8 mg by mouth every 8 (eight) hours as needed. For spasticity   Yes Historical Provider, MD    Allergies:   Allergies  Allergen Reactions  . Ciprofloxacin Other (See Comments)    cramps  . Erythromycin Other (See Comments)    cramps  . Flagyl (Metronidazole Hcl) Other (See Comments)    cramps  . Keflex Other (See Comments)    Cramps   . Sulfa Antibiotics Hives and Itching    Social History:  reports that she has quit smoking. She does not have any smokeless tobacco history on file. She reports that she does not drink alcohol or use illicit drugs. The patient is normally at baseline able to participate in full activities of daily living without assistance. She lives at home with family.  Family History: She said her mom had a history of a leaky valve  Physical Exam: Filed Vitals:   04/01/11 1508 04/01/11 1716 04/01/11 1859  BP: 131/73 142/99 135/68  Pulse: 86 86 93  Temp: 97.9 F (36.6 C)    TempSrc: Oral    Resp: 17 16 19   SpO2: 97% 96% 100%   General: Alert and oriented x3, no acute distress, looks about stated age, mild fatigue HEENT, cephalic and atraumatic min) are moist Cardiovascular: Regular rate and rhythm, S1-S2, occasional ectopic beat Lungs: Clear to auscultation Bilaterally Abdomen: Soft, nontender, nondistended, positive bowel  sounds Extremity, clubbing or cyanosis or edema   Labs on Admission:   [facility] 04/01/11 1537  NA 142  K 3.7  CL 104  CO2 27  GLUCOSE 259*  BUN 9  CREATININE 0.53  CALCIUM 9.6  MG --  PHOS --    Basename 04/01/11 1537  AST 12  ALT 10  ALKPHOS 86  BILITOT 0.3  PROT 6.5  ALBUMIN 3.4*    Basename 04/01/11 1537  WBC 6.3  NEUTROABS --  HGB 12.7  HCT 37.7  MCV 85.9  PLT 243    Radiological Exams on Admission: Dg Chest Portable 1 View  04/01/2011   IMPRESSION: No acute findings.   Assessment/Plan Present on Admission:  .Diabetes mellitus: Noted elevated sugars here. She is followed closely by , so we'll not check an A1c. Continue home insulin 7030+ sliding-scale.  Marland KitchenHTN (hypertension): Continue home medications.  .Hyperlipidemia: Not on statin. Check lipid profile in the morning.  .Precordial chest pain: Principal problem. Very atypical and doubt that much that this is cardiac in nature. Given her multiple risk factors, will cycle enzymes x3 and if negative with milligrams a monitor, will discharge home in the morning.  Marland KitchenHypothyroidism: Continue Synthroid  After discussion with the patient, she is to be a full code.  We will respect these wishes.  I anticipate her length of stay to be one-day and discharge tomorrow based on exam, normal labs and history.  Time spent on this patient including examination and decision-making process: 30 minutes.  Hollice Espy [phone] 04/01/2011, 7:54 PM

## 2011-04-01 NOTE — ED Notes (Signed)
Care transferred and reported given to [place], [place]

## 2011-04-01 NOTE — ED Notes (Signed)
Pt signed self out of [facility] where she was receiving care for cva with left sided weakness and went home to husband who has dementia.  DSS was out at the house assessing and patient admitted to having chest pain since yesterday.  Pt was cathed in [place] and said they said she needed a stent.  Pt took one asa at home and ems administered one nitro with relief of chest pain.  Pt has iv and is in nsr on monitor.

## 2011-04-01 NOTE — ED Notes (Signed)
Heart healthy dinner tray ordered for patient. Patient to be transported upstairs shortly. Meal tray will be delivered to patient on 2000

## 2011-04-01 NOTE — ED Notes (Signed)
2005-01 Ready

## 2011-04-01 NOTE — ED Provider Notes (Signed)
History     CSN: [id number]  Arrival date & time 04/01/11  1503   None     Chief Complaint  Patient presents with  . Chest Pain    (Consider location/radiation/quality/duration/timing/severity/associated sxs/prior treatment) HPI History provided by EMS and the patient.  66 year old female with a history of hypertension, hyperlipidemia, diabetes, and reported CAD presented with complaint of chest pain. Patient apparently had chest pain which started yesterday afternoon. This has been intermittent with yesterday's episode lasting about 30 minutes with recurrence today. Patient's pain is located in her central chest and radiates down in her abdomen. Her pain is described as if "he is going to explode." Patient rates her pain currently as a 0/10 but states that her pain was a 7-8/10 at the time of EMS arrival. Patient reports mild improvement with rest as well as complete resolution of her pain after one nitroglycerin sublingual given by EMS prior to ED arrival. Patient also received one aspirin 325 by EMS. Patient denies associated shortness of breath, diaphoresis, or nausea. Patient states that this is different than her prior chest pain.  Patient states that she has had a catheterization done in [place] about 3 years ago at which time she was reportedly supposed to have a stent but now was placed for unknown reason.  The patient has not been seen recently for this complaint. Patient was reportedly in a SNF but left AMA at about 2 weeks ago; DSS was reportedly at the patient's house today to assess her ability to take care of herself when she began complaining of chest pain.   Past Medical History  Diagnosis Date  . Diabetes mellitus   . Hypertension   . Thyroid disease   . Stroke     left side deficits  . Hypercholesteremia   . Coronary artery disease     No past surgical history on file.  No family history on file.  History  Substance Use Topics  . Smoking status: Former  Games developer  . Smokeless tobacco: Not on file  . Alcohol Use: No    OB History    Grav Para Term Preterm Abortions TAB SAB Ect Mult Living                  Review of Systems  Constitutional: Negative for fever and chills.  HENT: Negative for congestion, sore throat and rhinorrhea.   Eyes: Negative for pain and visual disturbance.  Respiratory: Positive for cough (mild x 2 weeks). Negative for shortness of breath and wheezing.   Cardiovascular: Positive for chest pain. Negative for palpitations.  Gastrointestinal: Negative for nausea, vomiting, abdominal pain, diarrhea and blood in stool.  Genitourinary: Negative for dysuria and hematuria.  Musculoskeletal: Negative for back pain and gait problem.  Skin: Negative for rash and wound.  Neurological: Positive for weakness (baseline on the Lt) and numbness (mild, baseline on the Lt). Negative for dizziness and headaches.  Psychiatric/Behavioral: Negative for confusion and agitation.  All other systems reviewed and are negative.    Allergies  Ciprofloxacin; Erythromycin; Flagyl; Keflex; and Sulfa antibiotics  Home Medications  Reviewed, please see RN note.  BP 131/73  Pulse 86  Temp(Src) 97.9 F (36.6 C) (Oral)  Resp 17  SpO2 97%  Physical Exam  Nursing note and vitals reviewed. Constitutional: No distress.       Alert, smiles  HENT:  Head: Normocephalic and atraumatic.  Right Ear: External ear normal.  Left Ear: External ear normal.  Nose: Nose normal.  Mouth/Throat:  Oropharynx is clear and moist.  Eyes: Conjunctivae and EOM are normal. Pupils are equal, round, and reactive to light.  Neck: Normal range of motion. Neck supple.  Cardiovascular: Normal rate, regular rhythm and intact distal pulses.   No murmur heard. Pulmonary/Chest: Effort normal. No respiratory distress. She has wheezes (mild in upper lung fields, more on the Lt).  Abdominal: Soft. Bowel sounds are normal. There is no tenderness.  Musculoskeletal: She  exhibits no edema and no tenderness.  Neurological: She is alert.       Decreased LUE/LLE strength but still with Lt shoulder and digit ROM, mild movement in Lt hip; decreased sensation to light touch (all reportedly baseline)  Skin: Skin is warm and dry. No rash noted. She is not diaphoretic.  Psychiatric: She has a normal mood and affect. Judgment normal.    ED Course  Procedures (including critical care time)   Date: 04/01/2011  Rate: 85  Rhythm: normal sinus rhythm  QRS Axis: normal  Intervals: normal  ST/T Wave abnormalities: TW flattening in V1-V2  Conduction Disutrbances: none   Labs Reviewed  COMPREHENSIVE METABOLIC PANEL - Abnormal; Notable for the following:    Glucose, Bld 259 (*)    Albumin 3.4 (*)    All other components within normal limits  GLUCOSE, CAPILLARY - Abnormal; Notable for the following:    Glucose-Capillary 242 (*)    All other components within normal limits  CBC  POCT I-STAT TROPONIN I  POCT I-STAT TROPONIN I  CARDIAC PANEL(CRET KIN+CKTOT+MB+TROPI)  CBC  CREATININE, SERUM  CARDIAC PANEL(CRET KIN+CKTOT+MB+TROPI)  LIPID PANEL  CARDIAC PANEL(CRET KIN+CKTOT+MB+TROPI)   Dg Chest Portable 1 View  04/01/2011  *RADIOLOGY REPORT*  Clinical Data: Intermittent left chest pain.  PORTABLE CHEST - 1 VIEW  Comparison: 10/08/2010.  Findings: Trachea is midline.  Heart size normal.  Lungs are clear. No pleural fluid.  IMPRESSION: No acute findings.  Original Report Authenticated By: Reyes Ivan, M.D.     1. Chest pain       MDM  66 year old female with multiple risk factors and reported history of CAD with complaint of chest pain. Pain relieved with rest and nitroglycerin, somewhat concerning for a ACS/U doubt PE, pneumonia, pneumothorax, or dissection.  EKG without acute ischemia. Aspirin given prior to arrival. Patient currently chest pain-free. Nitroglycerin paste placed. Chest x-ray without acute pathology. Initial troponin within normal limits.  Plan for outside hospital records to further evaluate patient's clinical history.    6:00 PM:  D/w cards; will see the pt.  Cards recs gen med admission.  Pt admitted to gen med without acute issues.        Particia Lather, MD 04/02/11 [phone]
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
## 2011-04-02 DIAGNOSIS — I6990 Unspecified sequelae of unspecified cerebrovascular disease: Secondary | ICD-10-CM

## 2011-04-02 DIAGNOSIS — I2510 Atherosclerotic heart disease of native coronary artery without angina pectoris: Secondary | ICD-10-CM

## 2011-04-02 DIAGNOSIS — R0789 Other chest pain: Secondary | ICD-10-CM

## 2011-04-02 LAB — URINALYSIS, ROUTINE W REFLEX MICROSCOPIC
Glucose, UA: 250 mg/dL — AB
Nitrite: NEGATIVE
Protein, ur: NEGATIVE mg/dL
pH: 6 (ref 5.0–8.0)

## 2011-04-02 LAB — URINE MICROSCOPIC-ADD ON

## 2011-04-02 LAB — GLUCOSE, CAPILLARY
Glucose-Capillary: 171 mg/dL — ABNORMAL HIGH (ref 70–99)
Glucose-Capillary: 97 mg/dL (ref 70–99)

## 2011-04-02 LAB — LIPID PANEL
Total CHOL/HDL Ratio: 4.7 RATIO
VLDL: 41 mg/dL — ABNORMAL HIGH (ref 0–40)

## 2011-04-02 LAB — CARDIAC PANEL(CRET KIN+CKTOT+MB+TROPI)
CK, MB: 1.9 ng/mL (ref 0.3–4.0)
Total CK: 59 U/L (ref 7–177)
Troponin I: <0.3 ng/mL (ref <0.30)

## 2011-04-02 MED ORDER — SIMVASTATIN 20 MG PO TABS
20.0000 mg | ORAL_TABLET | Freq: Every day | ORAL | Status: DC
  Administered 2011-04-02 – 2011-04-03 (×2): 20 mg via ORAL
  Filled 2011-04-02 (×3): qty 1

## 2011-04-02 MED ORDER — IBUPROFEN 400 MG PO TABS
400.0000 mg | ORAL_TABLET | ORAL | Status: DC | PRN
  Filled 2011-04-02: qty 1

## 2011-04-02 MED ORDER — GLUCERNA SHAKE PO LIQD
237.0000 mL | Freq: Two times a day (BID) | ORAL | Status: DC
  Administered 2011-04-02 – 2011-04-04 (×3): 237 mL via ORAL

## 2011-04-02 NOTE — Progress Notes (Signed)
INITIAL ADULT NUTRITION ASSESSMENT Date: 04/02/2011   Time: 11:02 AM Reason for Assessment: Conslt  ASSESSMENT: Female 66 y.o.  Dx: Precordial chest pain  Hx:  Past Medical History  Diagnosis Date  . Diabetes mellitus   . Hypertension   . Hypercholesteremia   . Coronary artery disease   . Angina   . COPD (chronic obstructive pulmonary disease)   . Sleep apnea   . Shortness of breath on exertion   . Hypothyroidism   . Thyroid disease   . H/O hiatal hernia   . Stomach ulcer     "tx'd w/medicine"  . Migraines   . Stroke 03/15/2010    "shut down my whole left side"  . Arthritis   . Anxiety    Related Meds:     . sodium chloride   Intravenous STAT  . amLODipine  5 mg Oral Daily  . aspirin EC  81 mg Oral Daily  . citalopram  20 mg Oral Daily  . clopidogrel  75 mg Oral Daily  . enoxaparin  30 mg Subcutaneous Q24H  . insulin aspart  0-5 Units Subcutaneous QHS  . insulin aspart  0-9 Units Subcutaneous TID WC  . insulin aspart protamine-insulin aspart  20 Units Subcutaneous BID WC  . latanoprost  1 drop Both Eyes QHS  . levothyroxine  50 mcg Oral QAC breakfast  . nitroGLYCERIN  1 inch Topical Q6H  . simvastatin  20 mg Oral q1800  . sodium chloride  3 mL Intravenous Q12H     Ht: 67" (170.2 cm)  Wt: 144 lb 3.2 oz (65.409 kg)  Ideal Wt: 61.4 kg % Ideal Wt: 107%  Usual Wt: 152 lbs (69 kg) when left [facility] per patient report  % Usual Wt: 95%  BMI= 22.6 kg/(m^2) WNL  Food/Nutrition Related Hx: Patient reports losing weight while at [facility] due to not liking the food provided. Has continued to lose weight since returning home from [facility]. States that her husband does not want to take care of her, sometimes refuses to eat when he cooks because "he cooks with too much salt." Patient has previously been drinking Boost/Ensure when available.   Labs:  CMP     Component Value Date/Time   NA 142 04/01/2011 1537   K 3.7 04/01/2011 1537   CL 104 04/01/2011 1537   CO2 27 04/01/2011 1537     GLUCOSE 259* 04/01/2011 1537   BUN 9 04/01/2011 1537   CREATININE 0.50 04/01/2011 1959   CALCIUM 9.6 04/01/2011 1537   PROT 6.5 04/01/2011 1537   ALBUMIN 3.4* 04/01/2011 1537   AST 12 04/01/2011 1537   ALT 10 04/01/2011 1537   ALKPHOS 86 04/01/2011 1537   BILITOT 0.3 04/01/2011 1537   GFRNONAA >90 04/01/2011 1959   GFRAA >90 04/01/2011 1959   CBG (last 3)   Basename 04/02/11 0619 04/01/11 2125  GLUCAP 223* 242*        Intake/Output Summary (Last 24 hours) at 04/02/11 1110 Last data filed at 04/02/11 1000  Gross per 24 hour  Intake 1338.75 ml  Output   1000 ml  Net 338.75 ml     Diet Order: Carb Control  Supplements/Tube Feeding: none  IVF: 75 ml/hr for 12 hours, almost complete  Estimated Nutritional Needs:   Kcal:  Protein:  Fluid:   Patient was tearful at time relating her story. Feels that she has not been well taken care of at [facility] or at home. Sometimes would refuse meals. Weight loss started about 1  year ago after stroke. Unable to confirm weight prior to stroke. Patient has lost 8 lbs (5%) since leaving [facility], unable to confirm time frame of weight loss.   NUTRITION DIAGNOSIS: -Inadequate oral intake (NI-2.1).  Status: Ongoing  RELATED TO: refusing meals   AS EVIDENCE BY: weight loss 5%  MONITORING/EVALUATION(Goals): Goal: Patient will continue to meet nutrition needs with meals and supplements while inpatient Monitor: Po intake, weight, labs  EDUCATION NEEDS: -No education needs identified at this time  INTERVENTION: 1. Add Glucerna BID to help maintain weight 2. RD will follow  Dietitian [phone]  DOCUMENTATION CODES Per approved criteria  -Not Applicable    Clarene Duke MARIE 04/02/2011, 11:02 AM

## 2011-04-02 NOTE — ED Provider Notes (Signed)
I saw and evaluated the patient, reviewed the resident's note and I agree with the findings and plan.  I saw the patient along with Dr. Lendell Caprice and agree with her assessment and plan.  The patient presented complaining of chest pain that started yesterday and worsened this afternoon.  This patient has an extensive pmh, including cad.  She had a stress echo done at an outside facility several years ago that was abnormal.  I was able to obtain this study report.  She tells me she had a heart cath but cannot recall where it was done.  For this reason, we were unable to obtain this.  On exam, the patient had a normal heart and lung exam and the ekg did not show an acute process.  The labs looked okay.    The patient also has a history of cva and was recently in an ecf for rehab.  She signed herself out of the facility recently as she says her husband has dementia and needs her help.  She has not been getting along very well at home.  Home health went to her home today and this is when she informed them of her chest pain.  She was then sent here for eval of this.  We have consulted cardiology who believes this to be more of a medicine admission.  We will consult them for admission.  Geoffery Lyons, MD 04/02/11 [phone]

## 2011-04-02 NOTE — Progress Notes (Signed)
Family Medicine Teaching Service Attending Note  I interviewed and examined patient [name] and reviewed their tests and x-rays.  I discussed with Dr. Konrad Dolores and reviewed their note for today.  I agree with their assessment and plan.     Additionally  This afternoon is alert and complains of mild left side pain.  No shortness of breath or chest pain Abdomen - soft nontender Heart - Regular rate and rhythm.  No murmurs, gallops or rubs.     Chest pain - non cardiac ? Trauma vs neuropathy from CVA - relates has been on gabapentin and lyrica but can not tolerate.  Medically stable for placement

## 2011-04-02 NOTE — Progress Notes (Signed)
Family Medicine Teaching Service [facility] Progress Note  Patient name: [name] Medical record number: [id number] Date of birth: 24-Feb-1945 Age: 66 y.o. Gender: female    LOS: 1 day   Primary Care Provider: Janace Hoard, MD, MD  Overnight Events:   PT TRANSFERRED TO OUR SERVICES THIS MORNING FROM INTERNAL MEDICINE TEAM WHO ADMITTED PT.   NAEO. Pt somewhat tearful this morning due to home situation. Pt willing to go to SNF as she recognizes unsafe to go home. Pt denies any recent falls or fractures, though does admit to falls in the past. Mild left lower rib pain and L flank pain still this am. Pain is much improved overall. Difficulty distinguishing pain from "Stroke pain." which has been present since stroke. Tolerating diet w/o difficulty.    Objective: Vital signs in last 24 hours: Temp:  [97.9 F (36.6 C)-98.5 F (36.9 C)] 98.2 F (36.8 C) (02/19 0437) Pulse Rate:  [77-99] 77  (02/19 0437) Resp:  [16-19] 18  (02/19 0437) BP: (128-169)/(68-99) 128/78 mmHg (02/19 0437) SpO2:  [96 %-100 %] 100 % (02/19 0437) Weight:  [144 lb 3.2 oz (65.409 kg)] 144 lb 3.2 oz (65.409 kg) (02/19 0437)  Wt Readings from Last 3 Encounters:  04/02/11 144 lb 3.2 oz (65.409 kg)     Current Facility-Administered Medications  Medication Dose Route Frequency Provider Last Rate Last Dose  . 0.9 %  sodium chloride infusion   Intravenous STAT Geoffery Lyons, MD 75 mL/hr at 04/01/11 2342    . acetaminophen (TYLENOL) tablet 650 mg  650 mg Oral Q6H PRN Hollice Espy, MD       Or  . acetaminophen (TYLENOL) suppository 650 mg  650 mg Rectal Q6H PRN Hollice Espy, MD      . amLODipine (NORVASC) tablet 5 mg  5 mg Oral Daily Hollice Espy, MD      . aspirin EC tablet 81 mg  81 mg Oral Daily Hollice Espy, MD      . citalopram (CELEXA) tablet 20 mg  20 mg Oral Daily Hollice Espy, MD      . clonazePAM Scarlette Calico) tablet 0.5 mg  0.5 mg Oral TID PRN Hollice Espy, MD      . clopidogrel  (PLAVIX) tablet 75 mg  75 mg Oral Daily Hollice Espy, MD   75 mg at 04/02/11 0742  . enoxaparin (LOVENOX) injection 30 mg  30 mg Subcutaneous Q24H Hollice Espy, MD   30 mg at 04/01/11 2348  . insulin aspart (novoLOG) injection 0-5 Units  0-5 Units Subcutaneous QHS Hollice Espy, MD   2 Units at 04/01/11 2344  . insulin aspart (novoLOG) injection 0-9 Units  0-9 Units Subcutaneous TID WC Hollice Espy, MD   3 Units at 04/02/11 [phone]  . insulin aspart protamine-insulin aspart (NOVOLOG 70/30) injection 20 Units  20 Units Subcutaneous BID WC Hollice Espy, MD   20 Units at 04/02/11 0743  . latanoprost (XALATAN) 0.005 % ophthalmic solution 1 drop  1 drop Both Eyes QHS Hollice Espy, MD   1 drop at 04/01/11 2347  . levothyroxine (SYNTHROID, LEVOTHROID) tablet 50 mcg  50 mcg Oral QAC breakfast Hollice Espy, MD   50 mcg at 04/02/11 0742  . nitroGLYCERIN (NITROGLYN) 2 % ointment 1 inch  1 inch Topical Q6H Amy Coker, MD      . ondansetron (ZOFRAN) tablet 4 mg  4 mg Oral Q6H PRN Hollice Espy, MD  Or  . ondansetron (ZOFRAN) injection 4 mg  4 mg Intravenous Q6H PRN Hollice Espy, MD      . sodium chloride 0.9 % injection 3 mL  3 mL Intravenous Q12H Hollice Espy, MD      . tiZANidine (ZANAFLEX) tablet 4-8 mg  4-8 mg Oral Q8H PRN Hollice Espy, MD         PE: Gen: NAD, tearful HEENT: MMM CV: RRR Res: CTAB, normal effort Abd: NABS, non-painful to palpation Ext/Musc: Pain on firm palpation of L rib just inferior to the L breast.  Neuro: Lft sided gross and fine motor deficits.   Labs/Studies:  Lab Results  Component Value Date   CKTOTAL 59 04/02/2011   CKMB 1.9 04/02/2011   TROPONINI <0.30 04/02/2011    Lab Results  Component Value Date   CHOL 186 04/02/2011   HDL 40 04/02/2011   LDLCALC 105* 04/02/2011   TRIG 207* 04/02/2011   CHOLHDL 4.7 04/02/2011   CBC:    Component Value Date/Time   WBC 5.9 04/01/2011 1959   HGB 12.4 04/01/2011 1959   HCT  37.6 04/01/2011 1959   PLT 250 04/01/2011 1959   MCV 86.2 04/01/2011 1959   NEUTROABS 3.0 10/22/2010 1730   LYMPHSABS 3.5 10/22/2010 1730   MONOABS 0.5 10/22/2010 1730   EOSABS 0.1 10/22/2010 1730   BASOSABS 0.0 10/22/2010 1730     Assessment/Plan: 66 year old AAF w/ one day h/o CP and abdominal pain w/ resolution of CP.  1. CP: musculoskeletal vs cardiac. Pt states this has resolved. Mild pain on palpation of L ribs. After reading through more ED notes, have some concern for capacity in this pt. Troponins negative. Clarified h/o non-obstructive CAD. - NSAIDs PRN pain - 3rd set of CE pending - continue on Tele  2. L flank pain: neuropathic stroke pain vs musculoskeletal vs acute illness/infectious. Improving today. No CVA tenderness - NSAIDs   3. DM : BG well controlled. Last A1c 8.0 - Continue SSI - Carb mod diet  4. Hypothyroidism:  - continue home synthroid  5. HLD: Lipid panel as above. Pt reports being on home simvastatin but unsure of home dose.  - Will start simvastatin 20   6. Social: CSW consulted to assist w/ placement. Pt clearly needing assistance. Apparently already has HH but likely to need SNF as husband at home w/ severe dementia.  - CSW - Case mgt.  7. Prophylaxis: - Lovenox - ASA - restart Plavix on DC  8. Disposition: Awaiting Plcmt and clinical improvemnt  Signed: Shelly Flatten, MD Family Medicine Resident PGY-1 04/02/2011 9:46 AM

## 2011-04-02 NOTE — Progress Notes (Signed)
Utilization review complete 
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
## 2011-04-03 ENCOUNTER — Observation Stay (HOSPITAL_COMMUNITY): Payer: Medicare Other

## 2011-04-03 LAB — GLUCOSE, CAPILLARY
Glucose-Capillary: 122 mg/dL — ABNORMAL HIGH (ref 70–99)
Glucose-Capillary: 151 mg/dL — ABNORMAL HIGH (ref 70–99)

## 2011-04-03 MED ORDER — IBUPROFEN 400 MG PO TABS
400.0000 mg | ORAL_TABLET | ORAL | Status: DC | PRN
  Administered 2011-04-04: 400 mg via ORAL
  Filled 2011-04-03: qty 1
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
## 2011-04-03 NOTE — Progress Notes (Signed)
Family Medicine Teaching Service Attending Note  I interviewed and examined patient [name] and reviewed their tests and x-rays.  I discussed with Dr. Konrad Dolores and reviewed their note for today.  I agree with their assessment and plan.     Additionally  Alert complains of left sided neuropathic sounding pain Discuss with her trial of cymbalta or nortriptylene for pain (has tried gabapentin and lyrica in the past) Medically stable for placement

## 2011-04-03 NOTE — Progress Notes (Signed)
Patient [name], 66 year old [place] American female struggles with multiple health and family issues.  Patient expressed appreciation for Chaplain's pastoral presence and conversation.  I will follow-up today.

## 2011-04-03 NOTE — Progress Notes (Signed)
OT Cancellation Note  Treatment cancelled today due to patient receiving procedure or test (pt at Xray).  Will continue to follow and perform eval as appropriate.  Evern Bio 04/03/2011, 1:43 PM

## 2011-04-03 NOTE — Progress Notes (Signed)
Patient [name], 72 year African American female suffers from complications of a stroke.  Also patient is struggling with financial challenges and marital tensions.  Patient is trying to decide:  whether to return home versus going to a retirement center; and whether or not to execute an advance directive (living will and/or health care power of attorney.  Patient expressed appreciation for Chaplain's pastoral presence and conversation.  I will follow-up as needed.

## 2011-04-03 NOTE — Progress Notes (Signed)
Family Medicine Teaching Service [facility] Progress Note  Patient name: [name] Medical record number: [id number] Date of birth: 1945/02/27 Age: 66 y.o. Gender: female    LOS: 2 days   Primary Care Provider: Janace Hoard, MD, MD  Overnight Events:  NAEO. Denies any abdominal or  CP but is complaining of L arm pain and continued L rib adn hip pain. No BM since admission. Continues to have intermittent HA consistent w/ pts previous HAs since stroke. Relieved by tylenol  Denies n/v/d, syncope, lightheadedness, dysuria, abd pain,   Objective: Vital signs in last 24 hours: Temp:  [98.1 F (36.7 C)-98.4 F (36.9 C)] 98.1 F (36.7 C) (02/20 0523) Pulse Rate:  [74-86] 80  (02/20 0523) Resp:  [17-18] 18  (02/20 0523) BP: (135-145)/(71-84) 135/71 mmHg (02/20 0523) SpO2:  [98 %-99 %] 99 % (02/20 0523) Weight:  [143 lb 11.8 oz (65.2 kg)] 143 lb 11.8 oz (65.2 kg) (02/20 0523)  Wt Readings from Last 3 Encounters:  04/03/11 143 lb 11.8 oz (65.2 kg)     Current Facility-Administered Medications  Medication Dose Route Frequency Provider Last Rate Last Dose  . 0.9 %  sodium chloride infusion   Intravenous STAT Geoffery Lyons, MD 75 mL/hr at 04/01/11 2342    . acetaminophen (TYLENOL) tablet 650 mg  650 mg Oral Q6H PRN Hollice Espy, MD   650 mg at 04/03/11 0448   Or  . acetaminophen (TYLENOL) suppository 650 mg  650 mg Rectal Q6H PRN Hollice Espy, MD      . amLODipine (NORVASC) tablet 5 mg  5 mg Oral Daily Hollice Espy, MD   5 mg at 04/02/11 0954  . aspirin EC tablet 81 mg  81 mg Oral Daily Hollice Espy, MD   81 mg at 04/02/11 0954  . citalopram (CELEXA) tablet 20 mg  20 mg Oral Daily Hollice Espy, MD   20 mg at 04/02/11 0954  . clonazePAM (KLONOPIN) tablet 0.5 mg  0.5 mg Oral TID PRN Hollice Espy, MD      . enoxaparin (LOVENOX) injection 30 mg  30 mg Subcutaneous Q24H Hollice Espy, MD   30 mg at 04/02/11 2154  . feeding supplement (GLUCERNA SHAKE) liquid 237  mL  237 mL Oral BID Rudean Haskell, RD   237 mL at 04/02/11 1740  . ibuprofen (ADVIL,MOTRIN) tablet 400 mg  400 mg Oral Q4H PRN Shelly Flatten, MD      . insulin aspart (novoLOG) injection 0-5 Units  0-5 Units Subcutaneous QHS Hollice Espy, MD   2 Units at 04/01/11 2344  . insulin aspart (novoLOG) injection 0-9 Units  0-9 Units Subcutaneous TID WC Hollice Espy, MD   1 Units at 04/03/11 [phone]  . insulin aspart protamine-insulin aspart (NOVOLOG 70/30) injection 20 Units  20 Units Subcutaneous BID WC Hollice Espy, MD   20 Units at 04/03/11 0750  . latanoprost (XALATAN) 0.005 % ophthalmic solution 1 drop  1 drop Both Eyes QHS Hollice Espy, MD   1 drop at 04/02/11 2154  . levothyroxine (SYNTHROID, LEVOTHROID) tablet 50 mcg  50 mcg Oral QAC breakfast Hollice Espy, MD   50 mcg at 04/03/11 [phone]  . nitroGLYCERIN (NITROGLYN) 2 % ointment 1 inch  1 inch Topical Q6H Amy Coker, MD      . ondansetron (ZOFRAN) tablet 4 mg  4 mg Oral Q6H PRN Hollice Espy, MD       Or  . ondansetron [facility])  injection 4 mg  4 mg Intravenous Q6H PRN Hollice Espy, MD      . simvastatin (ZOCOR) tablet 20 mg  20 mg Oral q1800 Shelly Flatten, MD   20 mg at 04/02/11 1740  . sodium chloride 0.9 % injection 3 mL  3 mL Intravenous Q12H Hollice Espy, MD   3 mL at 04/02/11 2156  . tiZANidine (ZANAFLEX) tablet 4-8 mg  4-8 mg Oral Q8H PRN Hollice Espy, MD   8 mg at 04/02/11 1350  . DISCONTD: clopidogrel (PLAVIX) tablet 75 mg  75 mg Oral Daily Hollice Espy, MD   75 mg at 04/02/11 0742     PE: Gen:NAD HEENT: MMM CV: RRR, no m/r/g ZOX:WRUE normal effort Abd:non-painful to palpatin Ext/Musc:diffuse tenderness of arm w/ particular pain on palpation of humerous. Continued pain of L rib just inferior to breast, L hip pain along anteriosuperior aspect of ischium Neuro: L sided weakness secondary to stroke.   Labs/Studies:  CBG (last 3)   Basename 04/03/11 0605 04/02/11 2148 04/02/11 1617   GLUCAP 122* 97 219*   UA: SMall leukocytes, negative nitrite, mucous present, few squamous cells, few bacteria    Assessment/Plan:  66 year old AAF w/ one day h/o CP and abdominal pain on admission w/ resolution of CP.   1. CP: musculoskeletal vs cardiac. Pt states this has resolved. Mild pain on palpation of L ribs. After reading through more ED notes, have some concern for capacity in this pt. Troponins negative. h/o non-obstructive CAD. CE negative x3. No events on Telemetry - NSAIDs PRN pain   2. L sided pain: neuropathic stroke pain vs musculoskeletal from fall/fracture vs acute illness/infectious. UA normal. Last fall approximately 1 week ago. No CVA tenderness  - NSAIDs  - will discuss imaging options w/ care team.   3. DM : BG well controlled. Last A1c 8.0  - Continue SSI  - Carb mod diet   4. Hypothyroidism:  - continue home synthroid   5. HLD: Lipid panel on admission. Pt reports being on home simvastatin but unsure of home dose.  - Continue simvastatin 20   6. Social: CSW and Case mgt assisting w/ placement. Appreciate their help greatly. PT recommending SNF - CSW  - Case mgt.   7. Prophylaxis:  - Lovenox  - ASA  - restart Plavix on DC   8. Disposition: Awaiting Plcmt and clinical improvemnt       Signed: Shelly Flatten, MD Family Medicine Resident PGY-1 04/03/2011 8:31 AM

## 2011-04-04 ENCOUNTER — Non-Acute Institutional Stay: Payer: Self-pay | Admitting: Family Medicine

## 2011-04-04 ENCOUNTER — Encounter: Payer: Self-pay | Admitting: Family Medicine

## 2011-04-04 DIAGNOSIS — F419 Anxiety disorder, unspecified: Secondary | ICD-10-CM

## 2011-04-04 DIAGNOSIS — Z789 Other specified health status: Secondary | ICD-10-CM | POA: Insufficient documentation

## 2011-04-04 DIAGNOSIS — E785 Hyperlipidemia, unspecified: Secondary | ICD-10-CM

## 2011-04-04 DIAGNOSIS — Z593 Problems related to living in residential institution: Secondary | ICD-10-CM

## 2011-04-04 DIAGNOSIS — Z8673 Personal history of transient ischemic attack (TIA), and cerebral infarction without residual deficits: Secondary | ICD-10-CM

## 2011-04-04 DIAGNOSIS — I10 Essential (primary) hypertension: Secondary | ICD-10-CM

## 2011-04-04 HISTORY — DX: Anxiety disorder, unspecified: F41.9

## 2011-04-04 LAB — GLUCOSE, CAPILLARY
Glucose-Capillary: 191 mg/dL — ABNORMAL HIGH (ref 70–99)
Glucose-Capillary: 200 mg/dL — ABNORMAL HIGH (ref 70–99)

## 2011-04-04 MED ORDER — POLYETHYLENE GLYCOL 3350 17 G PO PACK
17.0000 g | PACK | Freq: Every day | ORAL | Status: DC
  Administered 2011-04-04: 17 g via ORAL
  Filled 2011-04-04: qty 1

## 2011-04-04 MED ORDER — VENLAFAXINE HCL 37.5 MG PO TABS: 37.5000 mg | ORAL_TABLET | Freq: Two times a day (BID) | ORAL | Status: DC

## 2011-04-04 MED ORDER — POLYETHYLENE GLYCOL 3350 17 G PO PACK: 17.0000 g | PACK | Freq: Every day | ORAL | Status: DC

## 2011-04-04 MED ORDER — SIMVASTATIN 20 MG PO TABS: 20.0000 mg | ORAL_TABLET | Freq: Every day | ORAL | Status: DC

## 2011-04-04 MED ORDER — DULOXETINE HCL 20 MG PO CPEP: 20.0000 mg | ORAL_CAPSULE | Freq: Every day | ORAL | Status: DC

## 2011-04-04 NOTE — Discharge Instructions (Signed)
You were admitted to the hospital over concern that your pain was related to your heart. All of your studies came back normal. Please stop taking the citalopram and start taking Effexor in its place. Please also take all of your other medications as prescribed. Please follow up with your PCP or the physician at [facility] within the next few days.

## 2011-04-04 NOTE — Discharge Summary (Signed)
I have reviewed this discharge summary and agree.    

## 2011-04-04 NOTE — Assessment & Plan Note (Signed)
Will and lisinopril low dose due to DM and hx of CVA.

## 2011-04-04 NOTE — Progress Notes (Signed)
Clinical Child psychotherapist (CSW) informed family that a Letter of Guarantee will be given to the facility to pay for 2weeks of placement for pt who does not have a payer source and is in need of rehab. CSW informed family that after those 2 weeks family will need to make arrangements to either pay privately or have Medicaid in place. Pt and family appreciative of CSW assistance and stated they understood placement would only be for 2weeks. CSW informed Sonny Dandy that pt will be followed by the [facility] team. CSW has faxed dc summary, prepared dc packet and contacted [facility] for a 2pm pickup. CSW has also updated APS and informed them of pt disposition. APS is aware that pt will dc from [facility] in 2 weeks and family will need assistance with placement. APS was appreciative of update and will follow. CSW is signing off.  Theresia Bough, MSW, Theresia Majors [phone]

## 2011-04-04 NOTE — Progress Notes (Signed)
Subjective:    Patient ID: [name], female    DOB: 1945/08/11, 66 y.o.   MRN: [id number]  HPI [name] Ad[facility] History and Physical  Patient name: [name] record number: [id number] Date of birth: [birth year] Age: 66 y.o. Gender: female  Primary Care Provider: Tobin Chad, MD, MD  Chief Complaint: Deconditioning need rehabilitation before potential discharge home.  History of Present Illness: [name] is a 66 y.o. year old female presenting after hospitalization for chest pain. During the course of her hospitalization patient's chest pain was found to be noncardiac or pulmonary in nature. Seem to be more musculoskeletal patient though secondary to her stroke has a lot of left-sided weakness. Patient did not feel comfortable as well as physical therapy did not feel comfortable with patient going home by herself. Patient's closest family is in [place] and unable to care for patient at this time. He was the opinion of the patient as well as the team that patient would go to a skilled nursing facility for rehabilitation and monitoring.  Patient is very comfortable she states but also concern on everything going on, a lot of changes recently she states.   Patient Active Problem List  Diagnoses  . Diabetes mellitus  . History of CVA (cerebrovascular accident)  . HTN (hypertension)  . Hyperlipidemia  . Precordial chest pain  . Hypothyroidism  . Coronary artery disease, non-occlusive  . Anxiety  . Person living in residential institution  . Unspecified conditions influencing health status   Past [name] History: Past [name] History  Diagnosis Date  . Diabetes mellitus   . Hypertension   . Hypercholesteremia   . Coronary artery disease   . Angina   . COPD (chronic obstructive pulmonary disease)   . Sleep apnea   . Shortness of breath on exertion   . Hypothyroidism   . Thyroid disease   . H/O hiatal hernia   . Stomach ulcer     "tx'd  w/medicine"  . Migraines   . Stroke 03/15/2010    "shut down my whole left side"  . Arthritis   . Anxiety   . Anxiety 04/04/2011    Past Surgical History: Past Surgical History  Procedure Date  . Tonsillectomy and adenoidectomy ~ 1978  . Thyroid surgery     "don't know what it was; dr said it was big as grapefruit"  . Bunionectomy     bilaterally  . Carpal tunnel release ` 2000    bilaterally  . Shoulder open rotator cuff repair     bilaterally  . Cholecystectomy   . Bile duct exploration 07/1990    "stones, cyst, tumor removed; took bile duct out, took piece of intestine out; made me a bile duct  . Abdominal hysterectomy 1981  . Tubal ligation   . Dilation and curettage of uterus   . Cataract extraction w/ intraocular lens  implant, bilateral 01/2010    Social History: History   Social History  . Marital Status: Married    Spouse Name: N/A    Number of Children: N/A  . Years of Education: N/A   Social History Main Topics  . Smoking status: Former Smoker -- 1.0 packs/day for 10 years    Types: Cigarettes    Quit date: 02/12/1983  . Smokeless tobacco: Never Used  . Alcohol Use: No  . Drug Use: No  . Sexually Active: No   Other Topics Concern  . None   Social History Narrative  . None  Family History: History reviewed. No pertinent family history.  Allergies: Allergies  Allergen Reactions  . Ciprofloxacin Other (See Comments)    cramps  . Erythromycin Other (See Comments)    cramps  . Flagyl (Metronidazole Hcl) Other (See Comments)    cramps  . Keflex Other (See Comments)    Cramps   . Lyrica Other (See Comments)    hallucinations  . Neurontin (Gabapentin) Other (See Comments)    Stomach cramps  . Sulfa Antibiotics Hives and Itching    Current Outpatient Prescriptions  Medication Sig Dispense Refill  . amLODipine (NORVASC) 5 MG tablet Take 5 mg by mouth daily.      Marland Kitchen aspirin EC 81 MG tablet Take 81 mg by mouth daily.      . clonazePAM  (KLONOPIN) 0.5 MG tablet Take 0.5 mg by mouth 3 (three) times daily as needed. For anxiety      . clopidogrel (PLAVIX) 75 MG tablet Take 75 mg by mouth daily.      . DULoxetine (CYMBALTA) 20 MG capsule Take 1 capsule (20 mg total) by mouth daily.  30 capsule  11  . insulin aspart protamine-insulin aspart (NOVOLOG 70/30) (70-30) 100 UNIT/ML injection Inject 20 Units into the skin 2 (two) times daily with a meal.      . latanoprost (XALATAN) 0.005 % ophthalmic solution Place 1 drop into both eyes at bedtime.      Marland Kitchen levothyroxine (SYNTHROID, LEVOTHROID) 50 MCG tablet Take 50 mcg by mouth daily.      . polyethylene glycol (MIRALAX / GLYCOLAX) packet Take 17 g by mouth daily.  14 each  6  . simvastatin (ZOCOR) 20 MG tablet Take 1 tablet (20 mg total) by mouth daily at 6 PM.  30 tablet  6  . tiZANidine (ZANAFLEX) 4 MG tablet Take 4-8 mg by mouth every 8 (eight) hours as needed. For spasticity       No current facility-administered medications for this visit.    Review Of Systems: Per HPI with the following additions:Denies fever, chills, nausea vomiting abdominal pain, dysuria, chest pain, shortness of breath dyspnea on exertion or numbness in extremities other than what is noted above.   Otherwise 12 point review of systems was performed and was unremarkable.  Physical Exam: BP 125/75  HR  85   T 98.1    RR 18    Wt 143.74.  Gen:NAD, pleasant mild anxious very labile emotionally, would laugh and then cry within minutes, seemed overwhelm.  HEENT: MMM  CV: distant heart sounds RRR, no m/r/g  ZOX:WRUE, normal effort  Abd:BS, NT, ND Ext/Musc:. No pain on palpation of LE, hip, or arm today. Pt does have contracture of left hand 4th and fifth finger. Pt does have 3/5 strength in lower and upper extremity of left side.  Neuro: L sided weakness secondary to stroke both upper and lower extremity.  Skin warm and dry no rash.    Labs and Imaging: Lab Results  Component Value Date/Time   NA 142  04/01/2011  3:37 PM   K 3.7 04/01/2011  3:37 PM   CL 104 04/01/2011  3:37 PM   CO2 27 04/01/2011  3:37 PM   BUN 9 04/01/2011  3:37 PM   CREATININE 0.50 04/01/2011  7:59 PM   GLUCOSE 259* 04/01/2011  3:37 PM   Lab Results  Component Value Date   WBC 5.9 04/01/2011   HGB 12.4 04/01/2011   HCT 37.6 04/01/2011   MCV 86.2 04/01/2011  PLT 250 04/01/2011      Assessment and Plan: Mishika Flippen is a 66 y.o. year old female presenting for admission into nursing home for rehabilitation due to patient deconditioning.  S/p CVA with residual left side paralysis: Patient has residual weakness on the left side which makes it very difficult for her to do activities of daily living. Patient is on a statin at this time. Patient is not on an ARB or ACE inhibitor at this time with history of stroke as well as diabetes would start one very low dose. We will start lisinopril 5 mg daily and monitor blood pressures. Creatinine is at 0.5 today. Would consider repeating after patient has been on medication for a week. Will have physical therapy work with pt and likely do braces for her contracture as well.   Neuropathy:  Pt changed from Citalopram to cymbalta at time of DC. Will monitor and hope to increase in 2 weeks if needed should help with anxiety as well.    DM: controlled on SSI while hospitalized.  Pt used to be on 70/30 before at home, could not afford Lantus.  Was taking 20 units BID, will restart but will do 15 units BID and adjust accordingly. Would appreciate Dr. Raymondo Band input. A1c 8.0 5 months ago, will get repeat.   Hypothyroid: Continue Synthroid Lab Results  Component Value Date   TSH 3.761 10/23/2010  Get value in 1 week with BMET  Anxiety:  Pt on klonopin do not know how regularly she had it, will make it scheduled at half the dose and titrate down as tolerated. Would like to make PRN daily only in long run.   CAD:. Plavix was held and restarted on DC and ASA was continued thoughout, will continue both  now.  For hx of CAD.   HLD: Pt started on Simvastatin 20 during admission. Will increase to 40 mg QHS. Goal LDL <70 recheck in 3 months.   Dispo- Patient appears to have a lot of difficulties with ADL's at this time, we will give some PT while here and monitor if they feel patient will be able to improve but would expect a decent duration of stay.   Code status Full code  Review of Systems    Objective:   Physical Exam    Assessment & Plan:

## 2011-04-04 NOTE — Discharge Summary (Signed)
Family Medicine Resident Discharge Summary  Patient ID: [name] [id number] 66 y.o. Apr 30, 1945  Admit date: 04/01/2011  Discharge date and time: 04/04/11   Admitting Physician: Hollice Espy, MD   Discharge Physician: Oda Cogan MD  Admission Diagnoses: Chest pain [786.5] chest pain  Discharge Diagnoses: Neuropathic pain  Admission Condition: good  Discharged Condition: good  Indication for Admission: Chest pain in an individual w/ non-obstructive CAD  Hospital Course: 66yo AAF admitted for CP workup  CP: Pt w/ chest pain on admission. Initially admitted to internal medicine and then transferred to our service. Pts EKG and cardiac enzymes were all w/o signs of ischemia. Pt placed on telemetry and was w/o abnormal cardiac event during hospitalization. Chest pain specifically localized to L rib just inferior to breast. CXR w/o evidence of fracture. Pt w/ numerous pain complaints which varied slightly throughout admission. Pain likely Neuropathic as pt w/ h/o CVA and L sided partial paralysis. Majority of pain self-resolved. Only required tylenol once for pain during admission  Neuropathy: Pt w/ fairly constant neuropathic pain. Films of chest, arm and hip obtained and were w/o evidence of fracture. Pt reports h/o using multiple medications to treat, including neurontin, lyrica, cymbalta, amitriptyline, nortriptyline, and effexor. Cymbalta worked well, but pt unable to afford. Pt reported intolerance to lyrica and neurontin. Amitriptyline and nortriptyline make the pt sleep. Effexor was tried w/o much change though pt unable to discern her previous dose. Pt also w/ citalopram 20mg  Qday onboard on admission. Pt changed from Citalopram to cymbalta at time of DC.   DM: controlled on SSI while hospitalized.   Social: Pt w/ significant social stressors identified during admission. CSW and case management assisted w/ pt. Pt felt unsafe to go home and worked w/ CSW for placement. Pt  wishes to go live w/ daughter in [place] but  [facility] is right decsion for her at this time.   CAD: Pt placed on lovenox for DVT prophylaxis during admission. Plavix was held and restarted on DC and ASA was continued thoughout  HLD: Pt started on Simvastatin 20 during admission. Lipid panel below. Reports being on one previously w/ good tolerance.   Consults: none  Significant Diagnostic Studies:   Lab Results  Component Value Date   CKTOTAL 61 04/02/2011   CKMB 1.8 04/02/2011   TROPONINI <0.30 04/02/2011   EKG: WNL  Lipid Panel     Component Value Date/Time   CHOL 186 04/02/2011 0340   TRIG 207* 04/02/2011 0340   HDL 40 04/02/2011 0340   CHOLHDL 4.7 04/02/2011 0340   VLDL 41* 04/02/2011 0340   LDLCALC 105* 04/02/2011 0340   CXR, Hip XR, L arm XR: all w/o acute findings.   Discharge Exam: Gen:NAD, pleasant HEENT: MMM  CV: soft heart sounds RRR, no m/r/g  WUJ:WJXB, normal effort  Abd:non-painful to palpatin  Ext/Musc: Continued pain of L rib just inferior to breast. No pain on palpation of LE, hip, or arm today.  Neuro: L sided weakness secondary to stroke.   Disposition: long term care facility  Patient Instructions:  Medication List  As of 04/04/2011 11:32 AM   START taking these medications         DULoxetine 20 MG capsule   Commonly known as: CYMBALTA   Take 1 capsule (20 mg total) by mouth daily.      polyethylene glycol packet   Commonly known as: MIRALAX / GLYCOLAX   Take 17 g by mouth daily.      simvastatin  20 MG tablet   Commonly known as: ZOCOR   Take 1 tablet (20 mg total) by mouth daily at 6 PM.         CONTINUE taking these medications         amLODipine 5 MG tablet   Commonly known as: NORVASC      aspirin EC 81 MG tablet      clonazePAM 0.5 MG tablet   Commonly known as: KLONOPIN      clopidogrel 75 MG tablet   Commonly known as: PLAVIX      insulin aspart protamine-insulin aspart (70-30) 100 UNIT/ML injection   Commonly known as: NOVOLOG  70/30      latanoprost 0.005 % ophthalmic solution   Commonly known as: XALATAN      levothyroxine 50 MCG tablet   Commonly known as: SYNTHROID, LEVOTHROID      tiZANidine 4 MG tablet   Commonly known as: ZANAFLEX         STOP taking these medications         citalopram 20 MG tablet          Where to get your medications    These are the prescriptions that you need to pick up. We sent them to a specific pharmacy, so you will need to go there to get them.   [facility], [place] - [address]    [address] [place] [address]    Phone: [phone]        DULoxetine 20 MG capsule   polyethylene glycol packet   simvastatin 20 MG tablet           Activity: Up w/ assistance.  Diet: regular diet Wound Care: none needed  Follow-up with PCP. May change care to [facility] as pt will be at [facility].   Follow-up Items: 1. Neuropathic pain. Cymbalta to start after DC as pt will have this covered at [facility]. Consider adding Carbamazepine 2. Consider DC or at least reducing  Klonopin as pt has it for Anxiety but did not use during admission 3. Consider changing Tizanidine to Baclofen  Signed: Shelly Flatten, MD Family Medicine Resident PGY-1 04/04/2011 11:32 AM

## 2011-04-05 ENCOUNTER — Non-Acute Institutional Stay: Payer: Self-pay | Admitting: Family Medicine

## 2011-04-05 ENCOUNTER — Encounter: Payer: Self-pay | Admitting: Family Medicine

## 2011-04-05 DIAGNOSIS — R1032 Left lower quadrant pain: Secondary | ICD-10-CM | POA: Insufficient documentation

## 2011-04-05 DIAGNOSIS — I10 Essential (primary) hypertension: Secondary | ICD-10-CM

## 2011-04-05 DIAGNOSIS — Z8673 Personal history of transient ischemic attack (TIA), and cerebral infarction without residual deficits: Secondary | ICD-10-CM

## 2011-04-05 NOTE — Progress Notes (Signed)
Subjective:    Patient ID: [name], female    DOB: May 10, 1945, 66 y.o.   MRN: [id number]  HPI Ms. Riccardi reports that she has difficulty caring for herself and frequent falls since her stroke one year ago. Her family doctor is Dr. Alwyn Ren at [facility] and her neurologist is in [place]. She moves her second husband back to [place] about a year ago before her stroke. She has 4 children all of whom are living in [place] area. Her husband recently that is said that he is leaving going to [place] so she does not expect any support from him. He is 65 years old and is not very physically active. They have been living in a two-story apartment and she has been using a half bath and bed side commode on the first floor.  Since the stroke she has had left lower quadrant or pelvic pain which has not been able to find a cause. She's also had pain in the left shoulder and arm in the in the left leg. She had been on Cymbalta but did not have coverage for that so it was switched to citalopram. She switched back to Cymbalta during hospitalization. She denies urinary symptoms or incontinence except that she was she may have an accident if can't get to the bathroom fast enough. She denies current bowel problems as long as she takes Miralax  She does have emotional incontinence which is bothersome.     Review of Systems  HENT: Negative for hearing loss.   Eyes: Positive for visual disturbance.       She had cataract surgeries before her stroke Since the stroke she's had difficulty seeing to the left side  Tends to have dry eyes  Respiratory: Positive for cough.        Mild residual cough from a recent URI  Cardiovascular: Positive for chest pain.       No chest pain since she was given nitroglycerin on the way to the last hospitalization Known coronary artery blockage on catheterization several years ago. She's not been requiring nitroglycerin recently  Gastrointestinal: Positive for  constipation.       Controlled with MiraLAX  Musculoskeletal: Positive for arthralgias and gait problem.       She has a brace to prevent hyperextension of her left knee Is also told she needed a brace for her left ankle She has some shoulder arthralgias secondary to bilateral rotator cuff repairs but has good range of motion on the right  Neurological:       Since her stroke she tends to have frontal headache and numbness in the left side of her face       Objective:   Physical Exam  Constitutional: She is oriented to person, place, and time. She appears well-developed and well-nourished.  Eyes: Pupils are equal, round, and reactive to light.  Cardiovascular: Normal rate and regular rhythm.   Pulmonary/Chest: Effort normal and breath sounds normal. No respiratory distress. She has no wheezes. She has no rales.       No breast masses palpated.   Abdominal: Soft. She exhibits no distension and no mass. There is no tenderness. There is no rebound and no guarding.       Lower midline and upper mid to right quadrant old surgical scars.   Musculoskeletal: She exhibits no edema.       Vertical surgical scars over both anterior shoulders. Full range of motion on right, limited with weakness on  left   Lymphadenopathy:    She has no cervical adenopathy.  Neurological: She is alert and oriented to person, place, and time. No cranial nerve deficit.       Registers 3 words, recalls 3 left sided hemiparesis. Weak extension fingers and wrist left hand with mild contracture.  R knee tends to genu recuvatum.  Psychiatric: She has a normal mood and affect. Her behavior is normal.       Will rapidly cycle in and out of brief spells of depressed and tearful affect.           Assessment & Plan:

## 2011-04-05 NOTE — Assessment & Plan Note (Signed)
Therapists will evaluate for splints. Likely she has central cause of pain from her CVA

## 2011-04-05 NOTE — Assessment & Plan Note (Signed)
well controlled  

## 2011-04-05 NOTE — Assessment & Plan Note (Signed)
Consider trial of medication if not improved on Cymbalta

## 2011-04-19 ENCOUNTER — Other Ambulatory Visit: Payer: Self-pay | Admitting: Family Medicine

## 2011-04-19 MED ORDER — ACETAMINOPHEN 325 MG PO TABS: 650.0000 mg | ORAL_TABLET | Freq: Four times a day (QID) | ORAL | Status: DC | PRN

## 2011-04-30 ENCOUNTER — Encounter: Payer: Self-pay | Admitting: Pharmacist

## 2011-04-30 DIAGNOSIS — K5900 Constipation, unspecified: Secondary | ICD-10-CM | POA: Insufficient documentation

## 2011-04-30 DIAGNOSIS — I10 Essential (primary) hypertension: Secondary | ICD-10-CM

## 2011-05-06 ENCOUNTER — Encounter: Payer: Self-pay | Admitting: Pharmacist

## 2011-05-06 DIAGNOSIS — H04129 Dry eye syndrome of unspecified lacrimal gland: Secondary | ICD-10-CM | POA: Insufficient documentation

## 2011-05-22 ENCOUNTER — Encounter: Payer: Self-pay | Admitting: Pharmacist

## 2011-05-22 ENCOUNTER — Encounter: Payer: Self-pay | Admitting: Family Medicine

## 2011-05-22 ENCOUNTER — Non-Acute Institutional Stay: Payer: Self-pay | Admitting: Family Medicine

## 2011-05-22 ENCOUNTER — Other Ambulatory Visit: Payer: Self-pay | Admitting: Family Medicine

## 2011-05-22 DIAGNOSIS — I10 Essential (primary) hypertension: Secondary | ICD-10-CM

## 2011-05-22 DIAGNOSIS — M79605 Pain in left leg: Secondary | ICD-10-CM

## 2011-05-22 DIAGNOSIS — E785 Hyperlipidemia, unspecified: Secondary | ICD-10-CM

## 2011-05-22 DIAGNOSIS — F419 Anxiety disorder, unspecified: Secondary | ICD-10-CM

## 2011-05-22 NOTE — Assessment & Plan Note (Signed)
Titrating up Cymbalta with Goal of 60 mg daily.

## 2011-05-22 NOTE — Assessment & Plan Note (Signed)
Adequate blood pressure control.  No evidence of new end organ damage.  Tolerating medication without significant adverse effects.  Plan to continue current blood pressure regiment.   

## 2011-05-22 NOTE — Progress Notes (Signed)
  Subjective:    Patient ID: [name], female    DOB: [birth year], 66 y.o.   MRN: [id number]  HPI  Left leg pain Chronic problem No improvement since NH admission. Tolerating Cymbalta- No nausea, no tremor. Not receiveing as needed Zanaflex  Cough Onset in last week. Nonproductive No chills/fever. Worse at nite.  Associated clear rhinorrhea.   DIABETES No documented hypoglycemia Ranging mostly in 100 - 200s  No smoking. Pt has Medicaid pending.  Anticipate long-term intermediate care placement.  Patient is happy with staying at [facility].    Review of Systems See HPI.      Objective:   Physical Exam  Constitutional: No distress.       WC  Musculoskeletal:       Tender along left gastrocnemius muscle body. No edema.  4-5/5 ankle and knee flexion extension on left leg          Assessment & Plan:

## 2011-05-22 NOTE — Assessment & Plan Note (Signed)
Adequate glycemic control. Tolerating medications.  No new end-organ damage.  Continue current medications.  

## 2011-05-22 NOTE — Assessment & Plan Note (Signed)
Chronic issue for patient.  Has been treated for peripheral neuropathy and for central post-stroke origin of pain. Cymbalta has helped pain in past per patient but it was stopped because of cost.  Will increase Cymbalta to 30 mg daily and schedule tizanidine at 4 mg every 8 hours. Will titrate up Cymbalta to 60 mg in one week, if tolerating medications.

## 2011-05-23 ENCOUNTER — Non-Acute Institutional Stay: Payer: Self-pay | Admitting: Family Medicine

## 2011-05-23 ENCOUNTER — Encounter: Payer: Self-pay | Admitting: Family Medicine

## 2011-05-23 DIAGNOSIS — Z8673 Personal history of transient ischemic attack (TIA), and cerebral infarction without residual deficits: Secondary | ICD-10-CM

## 2011-05-23 DIAGNOSIS — I10 Essential (primary) hypertension: Secondary | ICD-10-CM

## 2011-05-23 DIAGNOSIS — E039 Hypothyroidism, unspecified: Secondary | ICD-10-CM

## 2011-05-23 DIAGNOSIS — Z593 Problems related to living in residential institution: Secondary | ICD-10-CM

## 2011-05-23 NOTE — Assessment & Plan Note (Signed)
Well-controlled no changes necessary. Continue to monitor closely.

## 2011-05-23 NOTE — Assessment & Plan Note (Signed)
Last A1c was at admission to the nursing home. It was 8.0, patient will continue current regimen in the nursing home. Her notebook glucose looks well controlled.

## 2011-05-23 NOTE — Assessment & Plan Note (Signed)
History of thyroid problems. Lab Results  Component Value Date   TSH 3.761 10/23/2010   will get repeat in 2 months with A1c.

## 2011-05-23 NOTE — Assessment & Plan Note (Signed)
Patient is still at her baseline very pleasant but still does have psuedo bulbar affect and do not expect this to improve.

## 2011-05-23 NOTE — Progress Notes (Signed)
  Subjective:    Patient ID: [name], female    DOB: August 05, 1945, 66 y.o.   MRN: [id number]  HPI  resident note nursing home visit.  Left leg pain Chronic problem No improvement since NH admission. This pain has been status post patient's CVA. Tolerating Cymbalta- No nausea, no tremor. Tapering up to a total of 60 mg daily. Not receiveing as needed Zanaflex    Hypertension Blood pressure at home:136/82  Blood pressure today: same Taking Meds:yes Side effects:no ROS: Denies headache visual changes nausea, vomiting, chest pain or abdominal pain or shortness of breath.    Diabetes:  High at home: 192 Low at home: 100 Taking medications: Yes please see med rec Side effects: No ROS: denies fever, chills, dizziness, loss of conscieness, polyuria poly dipsia numbness or tingling in extremities or chest pain.    Review of Systems  See HPI.      Objective:   Physical Exam  Constitutional: No distress.       WC  HENT:  Head: Normocephalic.  Eyes: EOM are normal. Pupils are equal, round, and reactive to light.  Neck: No thyromegaly present.  Cardiovascular: Normal rate, regular rhythm and normal heart sounds.   Pulmonary/Chest: Breath sounds normal. She is in respiratory distress.  Abdominal: Soft. She exhibits no distension. There is no tenderness.  Musculoskeletal: She exhibits tenderness. She exhibits no edema.       Patient does have mild contractures of the left lower shin and left upper tremor. Patient is wearing braces.  Neurological: She is alert. No cranial nerve deficit.  Psychiatric:       Patient's mood is here headache secondary to a pseudo-bulbar affect episodes of laughing and crying.          Assessment & Plan:

## 2011-06-21 ENCOUNTER — Encounter: Payer: Self-pay | Admitting: Pharmacist

## 2011-06-21 DIAGNOSIS — M79605 Pain in left leg: Secondary | ICD-10-CM

## 2011-06-26 ENCOUNTER — Non-Acute Institutional Stay: Payer: Self-pay | Admitting: Family Medicine

## 2011-06-26 DIAGNOSIS — M24549 Contracture, unspecified hand: Secondary | ICD-10-CM | POA: Insufficient documentation

## 2011-06-26 DIAGNOSIS — M79671 Pain in right foot: Secondary | ICD-10-CM | POA: Insufficient documentation

## 2011-06-26 DIAGNOSIS — M24542 Contracture, left hand: Secondary | ICD-10-CM

## 2011-06-26 NOTE — Assessment & Plan Note (Addendum)
Sore area medial right 2 nd toe without skin changes. Will ask for pad to be applied

## 2011-06-26 NOTE — Assessment & Plan Note (Signed)
Mainly due to CVA spasticity, but tight fascia suggestive of Dupuytren's Will consult hand surgery at her request

## 2011-06-26 NOTE — Progress Notes (Signed)
  Subjective:    Patient ID: [name], female    DOB: 17-Feb-1945, 66 y.o.   MRN: [id number]  HPI She had a note left in our box regarding contracture of left palmar fascia that prevents her from participation in OT.  Also complains of pain whereleft second toe still trying to override the first.  status post bunionectomy.  Says she does feel depressed.  Review of Systems     Objective:   Physical Exam  Constitutional: She appears well-nourished.  Cardiovascular: Normal rate and regular rhythm.   Pulmonary/Chest: Effort normal and breath sounds normal.  Musculoskeletal: She exhibits edema.       Bilateral palmar wrist surgical scars from "carpal tunnel" surgeries. Thickened fascia left palm over 4th flexor tendon.  Bilateral anterior shoulder surgical scars Bilateral bunionectomy scars. Short first toe on right  Tender thickening of skin of right medial second toe.    Neurological: She is alert.       Tearful, but will reverse rapidly Can't actively or passively extend left elbow past 100 degrees. Can't fully extend fingers of her left hand with weak grasp.           Assessment & Plan:

## 2011-06-26 NOTE — Assessment & Plan Note (Signed)
Difficult to differentiate from depression, but very labile. Will try dextromethorphan and Quinine.

## 2011-06-27 ENCOUNTER — Encounter: Payer: Self-pay | Admitting: Pharmacist

## 2011-07-02 ENCOUNTER — Emergency Department (HOSPITAL_COMMUNITY): Payer: Federal, State, Local not specified - PPO

## 2011-07-02 ENCOUNTER — Telehealth: Payer: Self-pay | Admitting: Family Medicine

## 2011-07-02 ENCOUNTER — Observation Stay (HOSPITAL_COMMUNITY)
Admission: EM | Admit: 2011-07-02 | Discharge: 2011-07-03 | Disposition: A | Discharge status: Skilled Nursing Facility | Payer: Federal, State, Local not specified - PPO | Source: Ambulatory Visit | Attending: Family Medicine | Admitting: Family Medicine

## 2011-07-02 DIAGNOSIS — J4489 Other specified chronic obstructive pulmonary disease: Secondary | ICD-10-CM | POA: Insufficient documentation

## 2011-07-02 DIAGNOSIS — M79605 Pain in left leg: Secondary | ICD-10-CM

## 2011-07-02 DIAGNOSIS — R0789 Other chest pain: Principal | ICD-10-CM | POA: Insufficient documentation

## 2011-07-02 DIAGNOSIS — H04129 Dry eye syndrome of unspecified lacrimal gland: Secondary | ICD-10-CM | POA: Insufficient documentation

## 2011-07-02 DIAGNOSIS — R079 Chest pain, unspecified: Secondary | ICD-10-CM | POA: Diagnosis present

## 2011-07-02 DIAGNOSIS — R109 Unspecified abdominal pain: Secondary | ICD-10-CM | POA: Insufficient documentation

## 2011-07-02 DIAGNOSIS — I2510 Atherosclerotic heart disease of native coronary artery without angina pectoris: Secondary | ICD-10-CM | POA: Insufficient documentation

## 2011-07-02 DIAGNOSIS — E785 Hyperlipidemia, unspecified: Secondary | ICD-10-CM

## 2011-07-02 DIAGNOSIS — I6990 Unspecified sequelae of unspecified cerebrovascular disease: Secondary | ICD-10-CM

## 2011-07-02 DIAGNOSIS — F419 Anxiety disorder, unspecified: Secondary | ICD-10-CM | POA: Diagnosis present

## 2011-07-02 DIAGNOSIS — I10 Essential (primary) hypertension: Secondary | ICD-10-CM | POA: Insufficient documentation

## 2011-07-02 DIAGNOSIS — M79609 Pain in unspecified limb: Secondary | ICD-10-CM | POA: Insufficient documentation

## 2011-07-02 DIAGNOSIS — G894 Chronic pain syndrome: Secondary | ICD-10-CM

## 2011-07-02 DIAGNOSIS — G4730 Sleep apnea, unspecified: Secondary | ICD-10-CM | POA: Insufficient documentation

## 2011-07-02 DIAGNOSIS — J449 Chronic obstructive pulmonary disease, unspecified: Secondary | ICD-10-CM | POA: Insufficient documentation

## 2011-07-02 DIAGNOSIS — E119 Type 2 diabetes mellitus without complications: Secondary | ICD-10-CM | POA: Insufficient documentation

## 2011-07-02 DIAGNOSIS — E039 Hypothyroidism, unspecified: Secondary | ICD-10-CM | POA: Diagnosis present

## 2011-07-02 LAB — POCT I-STAT, CHEM 8
BUN: 17 mg/dL (ref 6–23)
Calcium, Ion: 1.25 mmol/L (ref 1.12–1.32)
Creatinine, Ser: 0.8 mg/dL (ref 0.50–1.10)
Hemoglobin: 13.3 g/dL (ref 12.0–15.0)
TCO2: 27 mmol/L (ref 0–100)

## 2011-07-02 MED ORDER — ASPIRIN 325 MG PO TABS
325.0000 mg | ORAL_TABLET | ORAL | Status: AC
  Administered 2011-07-02: 325 mg via ORAL
  Filled 2011-07-02: qty 1

## 2011-07-02 NOTE — ED Provider Notes (Signed)
History     CSN: [id number]  Arrival date & time 07/02/11  2011   First MD Initiated Contact with Patient 07/02/11 2016      Chief Complaint  Patient presents with  . Chest Pain    (Consider location/radiation/quality/duration/timing/severity/associated sxs/prior treatment) Patient is a 66 y.o. female presenting with chest pain. The history is provided by the patient.  Chest Pain The chest pain began 1 - 2 hours ago. Duration of episode(s) is 1 hour. Chest pain occurs constantly. The chest pain is resolved. Associated with: unknown. At its most intense, the pain is at 8/10. The pain is currently at 0/10. The severity of the pain is moderate. Quality: crushing. Radiates to: from abdomen to chest. Pertinent negatives for primary symptoms include no fever, no fatigue, no shortness of breath, no cough, no abdominal pain, no nausea, no vomiting and no dizziness.  Pertinent negatives for associated symptoms include no diaphoresis. She tried nothing for the symptoms. Risk factors: DM, HTN, HLP, CAD.     Past Medical History  Diagnosis Date  . Diabetes mellitus   . Hypertension   . Hypercholesteremia   . Coronary artery disease   . Angina   . COPD (chronic obstructive pulmonary disease)   . Sleep apnea   . Shortness of breath on exertion   . Hypothyroidism   . Thyroid disease   . H/O hiatal hernia   . Stomach ulcer     "tx'd w/medicine"  . Migraines   . Stroke 03/15/2010    "shut down my whole left side"  . Arthritis   . Anxiety   . Anxiety 04/04/2011    Past Surgical History  Procedure Date  . Tonsillectomy and adenoidectomy ~ 1978  . Thyroid surgery     "don't know what it was; dr said it was big as grapefruit"  . Bunionectomy     bilaterally  . Carpal tunnel release ` 2000    bilaterally  . Shoulder open rotator cuff repair     bilaterally  . Cholecystectomy   . Bile duct exploration 07/1990    "stones, cyst, tumor removed; took bile duct out, took piece of intestine  out; made me a bile duct  . Abdominal hysterectomy 1981  . Tubal ligation   . Dilation and curettage of uterus   . Cataract extraction w/ intraocular lens  implant, bilateral 01/2010    No family history on file.  History  Substance Use Topics  . Smoking status: Former Smoker -- 1.0 packs/day for 10 years    Types: Cigarettes    Quit date: 02/12/1983  . Smokeless tobacco: Never Used  . Alcohol Use: No    OB History    Grav Para Term Preterm Abortions TAB SAB Ect Mult Living                  Review of Systems  Constitutional: Negative for fever, diaphoresis and fatigue.  HENT: Negative for congestion, drooling and neck pain.   Eyes: Negative for pain.  Respiratory: Negative for cough and shortness of breath.   Cardiovascular: Positive for chest pain.  Gastrointestinal: Negative for nausea, vomiting, abdominal pain and diarrhea.  Genitourinary: Negative for dysuria and hematuria.  Musculoskeletal: Negative for back pain and gait problem.  Skin: Negative for color change.  Neurological: Negative for dizziness and headaches.  Hematological: Negative for adenopathy.  Psychiatric/Behavioral: Negative for behavioral problems.  All other systems reviewed and are negative.    Allergies  Cephalexin; Ciprofloxacin; Erythromycin; Flagyl; Neurontin;  Pregabalin; and Sulfa antibiotics  Home Medications   Current Outpatient Rx  Name Route Sig Dispense Refill  . DEXTROMETHORPHAN HBR PO Oral Take 20 mg by mouth 2 (two) times daily. 20  Mg tab    . ROBITUSSIN MUCUS+CHEST CONGEST PO Oral Take 10 mLs by mouth every 4 (four) hours as needed. For cough    . POLYETHYLENE GLYCOL 3350 PO PACK Oral Take 17 g by mouth daily.    . QUINIDINE SULFATE PO Oral Take 10 mg by mouth 2 (two) times daily. Per mar 20 mg tab bid    . ACETAMINOPHEN 325 MG PO TABS Oral Take 650 mg by mouth every 8 (eight) hours.     . AMLODIPINE BESYLATE 5 MG PO TABS Oral Take 5 mg by mouth daily.    . ATORVASTATIN  CALCIUM 40 MG PO TABS Oral Take 1 tablet (40 mg total) by mouth daily.    Marland Kitchen CARBOXYMETHYLCELLULOSE SODIUM 1 % OP SOLN Both Eyes Place 1 drop into both eyes every 4 (four) hours as needed. For dry eyes    . CLONAZEPAM 0.5 MG PO TABS Oral Take 0.25 mg by mouth 2 (two) times daily.     Marland Kitchen CLOPIDOGREL BISULFATE 75 MG PO TABS Oral Take 75 mg by mouth daily.    . DULOXETINE HCL 30 MG PO CPEP Oral Take 1 capsule (30 mg total) by mouth daily.    . GUAIFENESIN ER 600 MG PO TB12 Oral Take 1,200 mg by mouth 2 (two) times daily as needed. For Cough    . INSULIN ASPART PROT & ASPART (70-30) 100 UNIT/ML [place] SUSP Subcutaneous Inject 20 Units into the skin 2 (two) times daily with a meal.    . LATANOPROST 0.005 % OP SOLN Both Eyes Place 1 drop into both eyes at bedtime.    Marland Kitchen LEVOTHYROXINE SODIUM 50 MCG PO TABS Oral Take 50 mcg by mouth daily.    Marland Kitchen LISINOPRIL 5 MG PO TABS Oral Take 1 tablet (5 mg total) by mouth daily.    Marland Kitchen TIZANIDINE HCL 4 MG PO TABS Oral Take 4 mg by mouth 3 (three) times daily. For spasticity    . TRAMADOL HCL 50 MG PO TABS Oral Take 50 mg by mouth every 6 (six) hours as needed. For pain      BP 145/69  Pulse 80  Resp 18  Ht 5' 7.5" (1.715 m)  Wt 152 lb (68.947 kg)  BMI 23.46 kg/m2  SpO2 100%  Physical Exam  Constitutional: She is oriented to person, place, and time. She appears well-developed and well-nourished.  HENT:  Head: Normocephalic.  Mouth/Throat: No oropharyngeal exudate.  Eyes: Conjunctivae and EOM are normal. Pupils are equal, round, and reactive to light.  Neck: Normal range of motion. Neck supple.  Cardiovascular: Normal rate, regular rhythm, normal heart sounds and intact distal pulses.  Exam reveals no gallop and no friction rub.   No murmur heard. Pulmonary/Chest: Effort normal and breath sounds normal. No respiratory distress. She has no wheezes.  Abdominal: Soft. Bowel sounds are normal. There is no tenderness.  Musculoskeletal: Normal range of motion. She  exhibits no edema and no tenderness.       Left sided weakness cw baseline 4/5 in LUE, LLE. 5/5 in RUE/RLE.   Neurological: She is alert and oriented to person, place, and time. No sensory deficit.       Left sided weakness  Skin: Skin is warm and dry.  Psychiatric: She has a normal mood and  affect. Her behavior is normal.    ED Course  Procedures (including critical care time)  Labs Reviewed  POCT I-STAT, CHEM 8 - Abnormal; Notable for the following:    Glucose, Bld 100 (*)    All other components within normal limits  POCT I-STAT TROPONIN I   Dg Chest 2 View  07/02/2011  *RADIOLOGY REPORT*  Clinical Data: Chest pain  CHEST - 2 VIEW  Comparison: 04/02/2011  Findings: Degraded by rotation.  Within this limitation, cardiomediastinal contours are unchanged.  No focal consolidation. No pleural effusion or pneumothorax.  Multilevel degenerative changes.  Advanced left shoulder DJD.  Postsurgical changes of the right shoulder.  Distal right clavicle has been resected.  IMPRESSION: No radiographic evidence of acute cardiopulmonary process.  Original Report Authenticated By: Waneta Martins, M.D.     1. Chest pain      Date: 07/02/2011  Rate: 84  Rhythm: normal sinus rhythm  QRS Axis: normal  Intervals: PR prolonged  ST/T Wave abnormalities: normal  Conduction Disutrbances:first-degree A-V block   Narrative Interpretation: No ST or T wave changes cw ischemia  Old EKG Reviewed: changes noted    MDM  10:48 PM 66 y.o. female w hx of CVA, CAD, DM pw left sided chest pressure that began approx 1-2 hrs ago while at rest. Pt notes the heaviness lasted for approx 1 hr, she denies this pain now. Pt AFVSS here, she appears well on exam. Presentation is atypical as pt notes pain radiated from abdomen up to chest. Will get labs.   10:48 PM Labs non-contrib thus far. Will admit to [facility] for cp r/o.   Clinical Impression 1. Chest pain          Purvis Sheffield, MD 07/02/11 [id number]

## 2011-07-02 NOTE — H&P (Signed)
[name] is an 66 y.o. female.   Chief Complaint: Chest Pain HPI: 66 yo F with multiple medical problems who is a resident at [facility] present with acute onset left sided chest pressure, which lasted ~1 hour.  Nursing at the facility called geriatric resident on call who recommended transfer to ED for evaluation.  Chest pain has since resolved.  It was located on the left side of her chest, but initially started in her left hip, where she had had chronic pain since her stroke 1 year ago.  She states that it started in left hip/LLQ and then radiated through her abdomen into her chest.  It was crushing in nature.  She denies SOB, diaphoresis, nausea with the pain.  It did not radiate to her jaw or left arm.  She was at rest when it started.  She is unsure what made it go away, but it resolved spontaneously.  She has not been having fevers or cough.  She continues to have some left hip/LLQ dullness.  She also feels like her left arm, which she has minimal function with since her stroke, feels heavier than usual.   While in the ED, she had one negative POCT troponin, EKG showing no ST elevation, and was given ASA 325mg .   Past Medical History  Diagnosis Date  . Diabetes mellitus   . Hypertension   . Hypercholesteremia   . Coronary artery disease   . Angina   . COPD (chronic obstructive pulmonary disease)   . Sleep apnea   . Shortness of breath on exertion   . Hypothyroidism   . Thyroid disease   . H/O hiatal hernia   . Stomach ulcer     "tx'd w/medicine"  . Migraines   . Stroke 03/15/2010    "shut down my whole left side"  . Arthritis   . Anxiety   . Anxiety 04/04/2011    Past Surgical History  Procedure Date  . Tonsillectomy and adenoidectomy ~ 1978  . Thyroid surgery     "don't know what it was; dr said it was big as grapefruit"  . Bunionectomy     bilaterally  . Carpal tunnel release ` 2000    bilaterally  . Shoulder open rotator cuff repair     bilaterally  . Cholecystectomy    . Bile duct exploration 07/1990    "stones, cyst, tumor removed; took bile duct out, took piece of intestine out; made me a bile duct  . Abdominal hysterectomy 1981  . Tubal ligation   . Dilation and curettage of uterus   . Cataract extraction w/ intraocular lens  implant, bilateral 01/2010    No family history on file. Social History:  reports that she quit smoking about 28 years ago. Her smoking use included Cigarettes. She has a 10 pack-year smoking history. She has never used smokeless tobacco. She reports that she does not drink alcohol or use illicit drugs.  Allergies:  Allergies  Allergen Reactions  . Cephalexin Other (See Comments)    Cramps   . Ciprofloxacin Other (See Comments)    cramps  . Erythromycin Other (See Comments)    cramps  . Flagyl (Metronidazole Hcl) Other (See Comments)    cramps  . Neurontin (Gabapentin) Other (See Comments)    Stomach cramps  . Pregabalin Other (See Comments)    hallucinations  . Sulfa Antibiotics Hives and Itching     (Not in a hospital admission)  Results for orders placed during the hospital encounter of  07/02/11 (from the past 48 hour(s))  POCT I-STAT, CHEM 8     Status: Abnormal   Collection Time   07/02/11  9:03 PM      Component Value Range Comment   Sodium 143  135 - 145 (mEq/L)    Potassium 4.3  3.5 - 5.1 (mEq/L)    Chloride 106  96 - 112 (mEq/L)    BUN 17  6 - 23 (mg/dL)    Creatinine, Ser 1.61  0.50 - 1.10 (mg/dL)    Glucose, Bld 096 (*) 70 - 99 (mg/dL)    Calcium, Ion 0.45  1.12 - 1.32 (mmol/L)    TCO2 27  0 - 100 (mmol/L)    Hemoglobin 13.3  12.0 - 15.0 (g/dL)    HCT 40.9  81.1 - 91.4 (%)   POCT I-STAT TROPONIN I     Status: Normal   Collection Time   07/02/11  9:15 PM      Component Value Range Comment   Troponin i, poc 0.00  0.00 - 0.08 (ng/mL)    Comment 3             Dg Chest 2 View  07/02/2011  *RADIOLOGY REPORT*  Clinical Data: Chest pain  CHEST - 2 VIEW  Comparison: 04/02/2011  Findings: Degraded by  rotation.  Within this limitation, cardiomediastinal contours are unchanged.  No focal consolidation. No pleural effusion or pneumothorax.  Multilevel degenerative changes.  Advanced left shoulder DJD.  Postsurgical changes of the right shoulder.  Distal right clavicle has been resected.  IMPRESSION: No radiographic evidence of acute cardiopulmonary process.  Original Report Authenticated By: Waneta Martins, M.D.    Review of Systems  Constitutional: Negative for fever, chills and diaphoresis.  HENT: Positive for congestion. Negative for hearing loss and sore throat.   Eyes: Negative for blurred vision, double vision and discharge.  Respiratory: Positive for cough. Negative for hemoptysis, sputum production, shortness of breath and wheezing.   Cardiovascular: Positive for chest pain. Negative for palpitations, claudication and leg swelling.  Gastrointestinal: Positive for abdominal pain. Negative for heartburn, nausea, vomiting, diarrhea, constipation and blood in stool.  Genitourinary: Negative for dysuria.  Musculoskeletal: Positive for joint pain.  Skin: Negative for rash.  Neurological: Positive for dizziness and weakness. Negative for tingling, loss of consciousness and headaches.    Blood pressure 145/69, pulse 80, resp. rate 18, height 5' 7.5" (1.715 m), weight 152 lb (68.947 kg), SpO2 100.00%. Physical Exam  Constitutional: She appears well-developed and well-nourished. No distress.  HENT:  Head: Normocephalic and atraumatic.  Nose: Nose normal.  Mouth/Throat: Oropharynx is clear and moist. No oropharyngeal exudate.  Eyes: Conjunctivae and EOM are normal. Pupils are equal, round, and reactive to light.  Neck: Normal range of motion. Neck supple.  Cardiovascular: Normal rate, regular rhythm, normal heart sounds and intact distal pulses.   No murmur heard.      No chest wall tenderness  Respiratory: Effort normal and breath sounds normal. No respiratory distress. She has no  wheezes. She has no rales.  GI: Soft. Bowel sounds are normal. She exhibits no distension. There is tenderness in the left lower quadrant.  Musculoskeletal: She exhibits no edema.  Lymphadenopathy:    She has no cervical adenopathy.  Neurological: She is alert.  Skin: Skin is warm and dry. No rash noted. She is not diaphoretic.     Assessment/Plan 66 yo F with h/o HTN, HLD, DM, non-obstructive CAD, h/o CVA presents with left sided chest pressure  1. Chest pain/pressure: patient with many risk factors and with h/o CAD; recent admission 03/2011 for similar; per pt report, she had a cath done in [place] which did not show any obstruction and did not require a stent to be placed -admit to telemetry  -cycles cardiac enzymes -AM EKG -Last cholesterol done 3 months ago (04/02/11) with LDL 105 -Last A1c 8.0 10/23/10; will recheck -Last TSH 3.761 on 10/23/10, will also recheck for risk stratification  -does not appear to be STEMI or NSTEMI as pt is now pain free, so no need for heparin gtt at this time; low threshold to start and consult cards tonight if chest pain returns -most recent ECHO in system 01/2010: normal EF (55-60%), grade 1 diastolic dysfunction   2. Left hip/LLQ pain: Unclear which the cause is on PE -does not appear to have a septic joint -will check a UA to r/o UTI -I would be curious to discuss with geriatric team prior to doing large work up -consider AXR vs hip XR vs both in the morning   3. HTN: currently normotensive -continue home regimen   4. DM: -check A1c (was 8.0 10/2010) -SSI; continue home 70/30    5. HLD: -last FLP <3 months ago showing relatively good control -continue home statin    6. Hypothyroidism: -check TSH (was normal 10/2010) -continue home synthroid    7. 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

## 2011-07-02 NOTE — Telephone Encounter (Signed)
Nurse called and noted that [name] is having left sided chest pain and subjective SOB. Vitals are stable.  I recommended transfer to the ER.

## 2011-07-02 NOTE — ED Notes (Signed)
Called and gave report to [place].

## 2011-07-02 NOTE — ED Notes (Signed)
The patient was sent to [place] from [facility] nursing home c/o left-sided chest tightness x 1 hr.  The patient states that the pain is worse with palpation.  Patient denies sob, n/v, and diaphoresis.  The has several left-sided neurological deficits that are consistent with her baseline since her CVA in March 15, 2010.

## 2011-07-03 ENCOUNTER — Other Ambulatory Visit: Payer: Self-pay | Admitting: Family Medicine

## 2011-07-03 ENCOUNTER — Encounter (HOSPITAL_COMMUNITY): Payer: Self-pay

## 2011-07-03 LAB — CBC
MCH: 28.9 pg (ref 26.0–34.0)
MCHC: 32.9 g/dL (ref 30.0–36.0)
Platelets: 235 10*3/uL (ref 150–400)
RBC: 4.05 MIL/uL (ref 3.87–5.11)

## 2011-07-03 LAB — TSH: TSH: 4.256 u[IU]/mL (ref 0.350–4.500)

## 2011-07-03 LAB — BASIC METABOLIC PANEL
Calcium: 9.4 mg/dL (ref 8.4–10.5)
GFR calc Af Amer: >90 mL/min (ref >90)
GFR calc non Af Amer: >90 mL/min (ref >90)
Glucose, Bld: 135 mg/dL — ABNORMAL HIGH (ref 70–99)
Potassium: 3.9 mEq/L (ref 3.5–5.1)
Sodium: 142 mEq/L (ref 135–145)

## 2011-07-03 LAB — URINALYSIS, ROUTINE W REFLEX MICROSCOPIC
Hgb urine dipstick: NEGATIVE
Nitrite: NEGATIVE
Protein, ur: NEGATIVE mg/dL
Specific Gravity, Urine: 1.006 (ref 1.005–1.030)
Urobilinogen, UA: 1 mg/dL (ref 0.0–1.0)

## 2011-07-03 LAB — CARDIAC PANEL(CRET KIN+CKTOT+MB+TROPI)
CK, MB: 1.9 ng/mL (ref 0.3–4.0)
Relative Index: INVALID (ref 0.0–2.5)
Total CK: 109 U/L (ref 7–177)

## 2011-07-03 LAB — HEMOGLOBIN A1C
Hgb A1c MFr Bld: 7.1 % — ABNORMAL HIGH (ref <5.7)
Mean Plasma Glucose: 157 mg/dL — ABNORMAL HIGH (ref <117)

## 2011-07-03 LAB — GLUCOSE, CAPILLARY: Glucose-Capillary: 126 mg/dL — ABNORMAL HIGH (ref 70–99)

## 2011-07-03 LAB — URINE MICROSCOPIC-ADD ON

## 2011-07-03 MED ORDER — SODIUM CHLORIDE 0.9 % IJ SOLN
3.0000 mL | Freq: Two times a day (BID) | INTRAMUSCULAR | Status: DC
Start: 2011-07-03 — End: 2011-07-03
  Administered 2011-07-03: 3 mL via INTRAVENOUS

## 2011-07-03 MED ORDER — LATANOPROST 0.005 % OP SOLN
1.0000 [drp] | Freq: Every day | OPHTHALMIC | Status: DC
  Administered 2011-07-03: 1 [drp] via OPHTHALMIC
  Filled 2011-07-03: qty 2.5

## 2011-07-03 MED ORDER — CLONAZEPAM 0.5 MG PO TABS: 0.5000 mg | ORAL_TABLET | Freq: Two times a day (BID) | ORAL | Status: DC

## 2011-07-03 MED ORDER — LEVOTHYROXINE SODIUM 50 MCG PO TABS
50.0000 ug | ORAL_TABLET | Freq: Every day | ORAL | Status: DC
  Administered 2011-07-03: 50 ug via ORAL
  Filled 2011-07-03 (×2): qty 1

## 2011-07-03 MED ORDER — INSULIN ASPART PROT & ASPART (70-30 MIX) 100 UNIT/ML ~~LOC~~ SUSP
20.0000 [IU] | Freq: Two times a day (BID) | SUBCUTANEOUS | Status: DC
  Administered 2011-07-03: 20 [IU] via SUBCUTANEOUS
  Filled 2011-07-03: qty 3

## 2011-07-03 MED ORDER — DULOXETINE HCL 30 MG PO CPEP
30.0000 mg | ORAL_CAPSULE | Freq: Every day | ORAL | Status: DC
  Administered 2011-07-03: 30 mg via ORAL
  Filled 2011-07-03: qty 1

## 2011-07-03 MED ORDER — POLYETHYLENE GLYCOL 3350 17 G PO PACK
17.0000 g | PACK | Freq: Every day | ORAL | Status: DC
  Administered 2011-07-03: 17 g via ORAL
  Filled 2011-07-03: qty 1

## 2011-07-03 MED ORDER — TIZANIDINE HCL 4 MG PO TABS
4.0000 mg | ORAL_TABLET | Freq: Three times a day (TID) | ORAL | Status: DC
  Administered 2011-07-03 (×2): 4 mg via ORAL
  Filled 2011-07-03 (×4): qty 1

## 2011-07-03 MED ORDER — SODIUM CHLORIDE 0.9 % IJ SOLN
3.0000 mL | Freq: Two times a day (BID) | INTRAMUSCULAR | Status: DC
  Administered 2011-07-03: 3 mL via INTRAVENOUS

## 2011-07-03 MED ORDER — CLONAZEPAM 0.5 MG PO TABS
0.2500 mg | ORAL_TABLET | Freq: Two times a day (BID) | ORAL | Status: DC
  Administered 2011-07-03 (×2): 0.25 mg via ORAL
  Filled 2011-07-03 (×2): qty 1

## 2011-07-03 MED ORDER — SODIUM CHLORIDE 0.9 % IJ SOLN: 3.0000 mL | INTRAMUSCULAR | Status: DC | PRN

## 2011-07-03 MED ORDER — INSULIN ASPART 100 UNIT/ML ~~LOC~~ SOLN
0.0000 [IU] | Freq: Three times a day (TID) | SUBCUTANEOUS | Status: DC
  Administered 2011-07-03: 1 [IU] via SUBCUTANEOUS
  Administered 2011-07-03: 3 [IU] via SUBCUTANEOUS

## 2011-07-03 MED ORDER — AMLODIPINE BESYLATE 5 MG PO TABS
5.0000 mg | ORAL_TABLET | Freq: Every day | ORAL | Status: DC
  Administered 2011-07-03: 5 mg via ORAL
  Filled 2011-07-03: qty 1

## 2011-07-03 MED ORDER — POLYVINYL ALCOHOL 1.4 % OP SOLN: 1.0000 [drp] | OPHTHALMIC | Status: DC | PRN

## 2011-07-03 MED ORDER — TRAMADOL HCL 50 MG PO TABS: 50.0000 mg | ORAL_TABLET | Freq: Four times a day (QID) | ORAL | Status: DC | PRN

## 2011-07-03 MED ORDER — LISINOPRIL 5 MG PO TABS
5.0000 mg | ORAL_TABLET | Freq: Every day | ORAL | Status: DC
  Administered 2011-07-03: 5 mg via ORAL
  Filled 2011-07-03: qty 1

## 2011-07-03 MED ORDER — ACETAMINOPHEN 325 MG PO TABS
650.0000 mg | ORAL_TABLET | Freq: Three times a day (TID) | ORAL | Status: DC
  Administered 2011-07-03 (×3): 650 mg via ORAL
  Filled 2011-07-03 (×3): qty 2

## 2011-07-03 MED ORDER — CARBOXYMETHYLCELLULOSE SODIUM 1 % OP SOLN: 1.0000 [drp] | OPHTHALMIC | Status: DC | PRN

## 2011-07-03 MED ORDER — CLOPIDOGREL BISULFATE 75 MG PO TABS
75.0000 mg | ORAL_TABLET | Freq: Every day | ORAL | Status: DC
  Administered 2011-07-03: 75 mg via ORAL
  Filled 2011-07-03: qty 1

## 2011-07-03 MED ORDER — SODIUM CHLORIDE 0.9 % IV SOLN: 250.0000 mL | INTRAVENOUS | Status: DC | PRN

## 2011-07-03 MED ORDER — ATORVASTATIN CALCIUM 40 MG PO TABS
40.0000 mg | ORAL_TABLET | Freq: Every day | ORAL | Status: DC
  Administered 2011-07-03: 40 mg via ORAL
  Filled 2011-07-03: qty 1

## 2011-07-03 NOTE — Progress Notes (Signed)
Pt d/c back to [facility] via ambulance, report called to [place] at 3:04 pm. Pt in good spirits.  Ninetta Lights RN

## 2011-07-03 NOTE — H&P (Signed)
I have seen and examined this patient. I have discussed with Dr Fara Boros.  I agree with their findings and plans as documented in their admission note.  Acute Issues 1. Left lower abdomen and left groin/proximal thigh pain  - Left lower abdomen pain and left leg pain a chronic problem for patient - Associated with recurrent muscle spasm in left thigh/leg. 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 - EKG unremarkable - CHEST XRAY without acute findings - Cardiac enzymes unremarkable.  - Working explanation of event: Post-CVA muscle spasm. Less likely ACS.  _Recommendations: - Complete ACS rule out - Consider starting Baclofen scheduled for frequent muscle spasms. - Return to [facility] today, if possible.

## 2011-07-03 NOTE — Progress Notes (Addendum)
Clinical Child psychotherapist (CSW) just informed that pt is stable for dc back to [place]. CSW was informed by Duwayne Heck at [facility] that they are willing to accept pt back with current pasarr as it does not expire until Jul 06, 2011. CSW will inform [facility] that pt will need to be evaluated at [facility] for an updated pasarr. CSW supervisor completing and faxing LOG to [place]. CSW has prepared and placed dc packet in [place] and contacted [facility] for a non emergency ambulance transport to [facility] at 15:30. CSW has also provided RN with [facility] number [phone] to provide RN report. Pt aware and agreeable, CSW signing off.  Theresia Bough, MSW, Theresia Majors [phone]

## 2011-07-03 NOTE — Discharge Summary (Signed)
Family Medicine Resident Discharge Summary  Patient ID: [name] [id number] 66 y.o. December 25, 1945  Admit date: 07/02/2011  Discharge date and time: No discharge date for patient encounter.   Admitting Physician: Leighton Roach McDiarmid, MD   Discharge Physician: Pearlean Brownie, MD  Admission Diagnoses: Chest pain [786.5] Dry eye syndrome [375.15] Hyperlipidemia [272.4] HTN (hypertension) [401.9] Leg pain, left [729.5] Chest Wall Pain  Discharge Diagnoses: Atypical chest pain  Admission Condition: good  Discharged Condition: good  Indication for Admission: Chest pain w/ multiple risk factors  Hospital Course: 66 yo F with h/o HTN, HLD, DM, non-obstructive CAD, h/o CVA presents with left sided chest pressure  Chest pain: Pt admitted w/ chest pain/pressure that sounded non cardiac in nature based on exam and HPI, but due to multiple risk factor indluding h/o CAD, pt was admitted for cardiac CP r/o. Pts EKG showed no STEMI, CXR unremarkable, and CE negative x2 during admission. Pts CP completely resolved by the time she was admitted. CP likely musculoskeletal vs psychosomatic.  Left hip pain/LLQ pain: etiology unknown. PE as above. UA negative. No further workup as given report that this is chronic complaint for pt that she has had worked up in the past w/o dx or resolution.   HTN/DM: Controlled on home medications. Additional SSI QAC provided as needed  ordered additional labs for risk stratification including TSH, A1c, and lipid panel (not performed)  Consults: none  Significant Diagnostic Studies:  Lab Results  Component Value Date   CKTOTAL 98 07/03/2011   CKMB 1.8 07/03/2011   TROPONINI <0.30 07/03/2011   CXR: No radiographic evidence of acute cardiopulmonary process.  EKG: no STEMI. Pt moving  CBC:    Component Value Date/Time   WBC 6.4 07/03/2011 0555   HGB 11.7* 07/03/2011 0555   HCT 35.6* 07/03/2011 0555   PLT 235 07/03/2011 0555   MCV 87.9 07/03/2011 0555   NEUTROABS 3.0 10/22/2010 1730   LYMPHSABS 3.5 10/22/2010 1730   MONOABS 0.5 10/22/2010 1730   EOSABS 0.1 10/22/2010 1730   BASOSABS 0.0 10/22/2010 1730    Discharge Exam: Gen:NAD, WNWD  HEENT: MMM  CV: RRR, no m/r/g  WUJ:WJXB, normal effort  Abd: soft non-tender, NABS  Ext/Musc: mild pain on palpation of Anterior superior iliac spine  Disposition: long term care facility  Patient Instructions:   Medication List  As of 07/03/2011  2:55 PM   TAKE these medications         acetaminophen 325 MG tablet   Commonly known as: TYLENOL   Take 650 mg by mouth every 8 (eight) hours.      amLODipine 5 MG tablet   Commonly known as: NORVASC   Take 5 mg by mouth daily.      atorvastatin 40 MG tablet   Commonly known as: LIPITOR   Take 1 tablet (40 mg total) by mouth daily.      carboxymethylcellulose 1 % ophthalmic solution   Place 1 drop into both eyes every 4 (four) hours as needed. For dry eyes      clonazePAM 0.5 MG tablet   Commonly known as: KLONOPIN   Take 0.25 mg by mouth 2 (two) times daily.      clopidogrel 75 MG tablet   Commonly known as: PLAVIX   Take 75 mg by mouth daily.      CYMBALTA 30 MG capsule   Generic drug: DULoxetine   Take 1 capsule (30 mg total) by mouth daily.      DEXTROMETHORPHAN HBR  PO   Take 20 mg by mouth 2 (two) times daily. 20  Mg tab      guaiFENesin 600 MG 12 hr tablet   Commonly known as: MUCINEX   Take 1,200 mg by mouth 2 (two) times daily as needed. For Cough      ROBITUSSIN MUCUS+CHEST CONGEST PO   Take 10 mLs by mouth every 4 (four) hours as needed. For cough      insulin aspart protamine-insulin aspart (70-30) 100 UNIT/ML injection   Commonly known as: NOVOLOG 70/30   Inject 20 Units into the skin 2 (two) times daily with a meal.      latanoprost 0.005 % ophthalmic solution   Commonly known as: XALATAN   Place 1 drop into both eyes at bedtime.      levothyroxine 50 MCG tablet   Commonly known as: SYNTHROID, LEVOTHROID   Take 50  mcg by mouth daily.      lisinopril 5 MG tablet   Commonly known as: PRINIVIL,ZESTRIL   Take 1 tablet (5 mg total) by mouth daily.      polyethylene glycol packet   Commonly known as: MIRALAX / GLYCOLAX   Take 17 g by mouth daily.      QUINIDINE SULFATE PO   Take 10 mg by mouth 2 (two) times daily. Per mar 20 mg tab bid      tiZANidine 4 MG tablet   Commonly known as: ZANAFLEX   Take 4 mg by mouth 3 (three) times daily. For spasticity      traMADol 50 MG tablet   Commonly known as: ULTRAM   Take 50 mg by mouth every 6 (six) hours as needed. For pain             Activity: activity as tolerated Diet: regular diet Wound Care: none needed  Follow-up with Geriatric team at [facility]  Follow-up Items: 1. HgbA1c  2. Lipid panel needs to be done. Not fasting so unable to order full panel during admission  Signed: Shelly Flatten, MD Family Medicine Resident PGY-1 [phone] 07/03/2011 2:37 PM

## 2011-07-03 NOTE — Progress Notes (Signed)
Family Medicine Teaching Service [facility] Progress Note  Patient name: [name] Medical record number: [id number] Date of birth: [birth year] Age: 66 y.o. Gender: female    LOS: 1 day   Primary Care Provider: SMITH,ZACHARY, DO, DO  Overnight Events:  NAEO. Doing well this morning. CP has completely resolved. Pt only has pain on palpation of bony prominence of Left hip (see PE). No complaints this am.  Objective: Vital signs in last 24 hours: Temp:  [97.8 F (36.6 C)-98.7 F (37.1 C)] 98.7 F (37.1 C) (05/22 1332) Pulse Rate:  [80-89] 89  (05/22 1332) Resp:  [18-19] 18  (05/22 1332) BP: (93-145)/(69-83) 121/73 mmHg (05/22 1332) SpO2:  [98 %-100 %] 99 % (05/22 1332) Weight:  [152 lb (68.947 kg)-154 lb 8.7 oz (70.1 kg)] 154 lb 8.7 oz (70.1 kg) (05/22 0020)  Wt Readings from Last 3 Encounters:  07/03/11 154 lb 8.7 oz (70.1 kg)  05/22/11 151 lb 12.8 oz (68.856 kg)  04/03/11 143 lb 11.8 oz (65.2 kg)     Current Facility-Administered Medications  Medication Dose Route Frequency Provider Last Rate Last Dose  . 0.9 %  sodium chloride infusion  250 mL Intravenous PRN Jacquelyn A McGill, MD      . acetaminophen (TYLENOL) tablet 650 mg  650 mg Oral Q8H Jacquelyn A McGill, MD   650 mg at 07/03/11 1354  . amLODipine (NORVASC) tablet 5 mg  5 mg Oral Daily Jacquelyn A McGill, MD   5 mg at 07/03/11 1036  . aspirin tablet 325 mg  325 mg Oral STAT Purvis Sheffield, MD   325 mg at 07/02/11 2057  . atorvastatin (LIPITOR) tablet 40 mg  40 mg Oral Daily Jacquelyn A McGill, MD   40 mg at 07/03/11 1036  . clonazePAM (KLONOPIN) tablet 0.25 mg  0.25 mg Oral BID Jacquelyn A McGill, MD   0.25 mg at 07/03/11 1036  . clopidogrel (PLAVIX) tablet 75 mg  75 mg Oral Daily Jacquelyn A McGill, MD   75 mg at 07/03/11 1036  . DULoxetine (CYMBALTA) DR capsule 30 mg  30 mg Oral Daily Jacquelyn A McGill, MD   30 mg at 07/03/11 1036  . insulin aspart (novoLOG) injection 0-9 Units  0-9 Units Subcutaneous TID WC  Tito Dine, MD   3 Units at 07/03/11 1142  . insulin aspart protamine-insulin aspart (NOVOLOG 70/30) injection 20 Units  20 Units Subcutaneous BID WC Tito Dine, MD   20 Units at 07/03/11 1043  . latanoprost (XALATAN) 0.005 % ophthalmic solution 1 drop  1 drop Both Eyes QHS Jacquelyn A McGill, MD   1 drop at 07/03/11 0101  . levothyroxine (SYNTHROID, LEVOTHROID) tablet 50 mcg  50 mcg Oral Q breakfast Tito Dine, MD   50 mcg at 07/03/11 0526  . lisinopril (PRINIVIL,ZESTRIL) tablet 5 mg  5 mg Oral Daily Jacquelyn A McGill, MD   5 mg at 07/03/11 1036  . polyethylene glycol (MIRALAX / GLYCOLAX) packet 17 g  17 g Oral Daily Jacquelyn A McGill, MD   17 g at 07/03/11 1036  . polyvinyl alcohol (LIQUIFILM TEARS) 1.4 % ophthalmic solution 1 drop  1 drop Both Eyes Q4H PRN Todd D McDiarmid, MD      . sodium chloride 0.9 % injection 3 mL  3 mL Intravenous Q12H Jacquelyn A McGill, MD   3 mL at 07/03/11 1036  . sodium chloride 0.9 % injection 3 mL  3 mL Intravenous PRN Tito Dine, MD      .  tiZANidine (ZANAFLEX) tablet 4 mg  4 mg Oral TID Tito Dine, MD   4 mg at 07/03/11 1036  . traMADol (ULTRAM) tablet 50 mg  50 mg Oral Q6H PRN Jacquelyn A McGill, MD      . DISCONTD: carboxymethylcellulose 1 % ophthalmic solution 1 drop  1 drop Both Eyes Q4H PRN Jacquelyn A McGill, MD      . DISCONTD: sodium chloride 0.9 % injection 3 mL  3 mL Intravenous Q12H Jacquelyn A McGill, MD   3 mL at 07/03/11 0100     PE: Gen:NAD, WNWD HEENT: MMM CV: RRR, no m/r/g ZOX:WRUE, normal effort Abd: soft non-tender, NABS Ext/Musc: mild pain on palpation of Anterior superior iliac spine   Labs/Studies:  Results for orders placed during the hospital encounter of 07/02/11 (from the past 24 hour(s))  POCT I-STAT, CHEM 8     Status: Abnormal   Collection Time   07/02/11  9:03 PM      Component Value Range   Sodium 143  135 - 145 (mEq/L)   Potassium 4.3  3.5 - 5.1 (mEq/L)   Chloride 106  96 -  112 (mEq/L)   BUN 17  6 - 23 (mg/dL)   Creatinine, Ser 4.54  0.50 - 1.10 (mg/dL)   Glucose, Bld 098 (*) 70 - 99 (mg/dL)   Calcium, Ion 1.19  1.47 - 1.32 (mmol/L)   TCO2 27  0 - 100 (mmol/L)   Hemoglobin 13.3  12.0 - 15.0 (g/dL)   HCT 82.9  56.2 - 13.0 (%)  POCT I-STAT TROPONIN I     Status: Normal   Collection Time   07/02/11  9:15 PM      Component Value Range   Troponin i, poc 0.00  0.00 - 0.08 (ng/mL)   Comment 3           TSH     Status: Normal   Collection Time   07/03/11 12:23 AM      Component Value Range   TSH 4.256  0.350 - 4.500 (uIU/mL)  CARDIAC PANEL(CRET KIN+CKTOT+MB+TROPI)     Status: Normal   Collection Time   07/03/11 12:23 AM      Component Value Range   Total CK 109  7 - 177 (U/L)   CK, MB 1.9  0.3 - 4.0 (ng/mL)   Troponin I <0.30  <0.30 (ng/mL)   Relative Index 1.7  0.0 - 2.5   BASIC METABOLIC PANEL     Status: Abnormal   Collection Time   07/03/11  5:55 AM      Component Value Range   Sodium 142  135 - 145 (mEq/L)   Potassium 3.9  3.5 - 5.1 (mEq/L)   Chloride 107  96 - 112 (mEq/L)   CO2 26  19 - 32 (mEq/L)   Glucose, Bld 135 (*) 70 - 99 (mg/dL)   BUN 13  6 - 23 (mg/dL)   Creatinine, Ser 8.65  0.50 - 1.10 (mg/dL)   Calcium 9.4  8.4 - 78.4 (mg/dL)   GFR calc non Af Amer >90  >90 (mL/min)   GFR calc Af Amer >90  >90 (mL/min)  CBC     Status: Abnormal   Collection Time   07/03/11  5:55 AM      Component Value Range   WBC 6.4  4.0 - 10.5 (K/uL)   RBC 4.05  3.87 - 5.11 (MIL/uL)   Hemoglobin 11.7 (*) 12.0 - 15.0 (g/dL)   HCT 69.6 (*) 29.5 -  46.0 (%)   MCV 87.9  78.0 - 100.0 (fL)   MCH 28.9  26.0 - 34.0 (pg)   MCHC 32.9  30.0 - 36.0 (g/dL)   RDW 16.1  09.6 - 04.5 (%)   Platelets 235  150 - 400 (K/uL)  GLUCOSE, CAPILLARY     Status: Abnormal   Collection Time   07/03/11  6:24 AM      Component Value Range   Glucose-Capillary 126 (*) 70 - 99 (mg/dL)   Comment 1 Notify RN    CARDIAC PANEL(CRET KIN+CKTOT+MB+TROPI)     Status: Normal   Collection Time    07/03/11  8:14 AM      Component Value Range   Total CK 98  7 - 177 (U/L)   CK, MB 1.8  0.3 - 4.0 (ng/mL)   Troponin I <0.30  <0.30 (ng/mL)   Relative Index RELATIVE INDEX IS INVALID  0.0 - 2.5   GLUCOSE, CAPILLARY     Status: Abnormal   Collection Time   07/03/11 11:20 AM      Component Value Range   Glucose-Capillary 236 (*) 70 - 99 (mg/dL)  URINALYSIS, ROUTINE W REFLEX MICROSCOPIC     Status: Abnormal   Collection Time   07/03/11  1:32 PM      Component Value Range   Color, Urine YELLOW  YELLOW    APPearance CLEAR  CLEAR    Specific Gravity, Urine 1.006  1.005 - 1.030    pH 6.5  5.0 - 8.0    Glucose, UA 100 (*) NEGATIVE (mg/dL)   Hgb urine dipstick NEGATIVE  NEGATIVE    Bilirubin Urine NEGATIVE  NEGATIVE    Ketones, ur NEGATIVE  NEGATIVE (mg/dL)   Protein, ur NEGATIVE  NEGATIVE (mg/dL)   Urobilinogen, UA 1.0  0.0 - 1.0 (mg/dL)   Nitrite NEGATIVE  NEGATIVE    Leukocytes, UA TRACE (*) NEGATIVE   URINE MICROSCOPIC-ADD ON     Status: Normal   Collection Time   07/03/11  1:32 PM      Component Value Range   Squamous Epithelial / LPF RARE  RARE    WBC, UA 0-2  <3 (WBC/hpf)   RBC / HPF 0-2  <3 (RBC/hpf)   Bacteria, UA RARE  RARE       Assessment/Plan: 66 yo AAF admitted for CP r/o.   CP: Resolved this am. musculoskeletol vs psychosomatic. Pt w/o CP today. CE negative x2, CXR unremarkable. EKG normal. TSH 4.256.  - Stable for Discharge  Hip Pain: Likely psychosomatic vs neuropathic pain from stoke. In speaking to inpt geriatric team, this pain is one that pt perseverates about at her facility and has received prior workup. Pt w/ numerous vague pain complaints from time to time that are unrelated, change, and do not warrant further workup at this time. UA negative.  HTN: Well controlled on home Rx. No change at this time  DM: Well controlled. Last A1c 8. Continue SSI; home 70/30. Hgb A1C odered but not completed yet.   PPX:  - ASA and plavix  Dispo: Likely DC later today  if cleared by accepting facility.    Signed: Shelly Flatten, MD Family Medicine Resident PGY-1 [phone] 07/03/2011 2:22 PM

## 2011-07-03 NOTE — Progress Notes (Signed)
Clinical Child psychotherapist (CSW) confirmed with [facility] that pt able to return when stable however pt will need to dc with a letter of guarantee as pt Medicaid is pending. CSW will inform CSW supervisor of need for LOG at dc. Facility also informed CSW that pt level II pasarr will expire on Jul 06, 2011.  CSW has submitted for a Level II pasarr and will await for a pasarr representative to complete a face to face evaluation with the pt in the hospital. CSW to follow and facilitate with dc when pt stable.  Theresia Bough, MSW, Theresia Majors [phone]

## 2011-07-03 NOTE — Clinical Social Work Psychosocial (Signed)
     Clinical Social Work Department BRIEF PSYCHOSOCIAL ASSESSMENT 07/03/2011  Patient:  [name], [name]     Account Number:  [id number]     Admit date:  07/02/2011  Clinical Social Worker:  Lourdes Sledge  Date/Time:  07/03/2011 10:06 AM  Referred by:  CSW  Date Referred:  07/03/2011 Referred for  SNF Placement   Other Referral:   Interview type:  Patient Other interview type:    PSYCHOSOCIAL DATA Living Status:  FACILITY Admitted from facility:  [facility] Level of care:  Skilled Nursing Facility Primary support name:  Rene Paci [phone] Primary support relationship to patient:  CHILD, ADULT Degree of support available:   Pt has 2 daughters however they are both out of state who are unable to provide the degree of support the pt needs. Pt is also married however pt spouse who is older, is unable to care for pt and himself.    CURRENT CONCERNS Current Concerns  Post-Acute Placement   Other Concerns:    SOCIAL WORK ASSESSMENT / PLAN CSW is familiar with this pt who dc'ed to [facility] 04/01/11. Pt at that that time did not have a payer source to cover skilled placement so a letter of guarantee was provided for 2 weeks of placement until family was able to find placement/get medicaid in place for pt.    CSW visited pt room and explored whether pt has received Medicaid to cover placement. Pt stated she believes her Medicaid is pending however is uncertain where she is in the process. Pt stated if she is able, she would like to return to [facility] at dc. CSW received consent to speak to pt daughters to determine what pt payer source is. CSW left a message for the admissions representative at [facility] and will contact pt daughters. CSW will also submit for a 30 day pasarr as the one pt has expired.   Assessment/plan status:  Psychosocial Support/Ongoing Assessment of Needs Other assessment/ plan:   Information/referral to community resources:     PATIENTS/FAMILYS RESPONSE TO PLAN OF CARE: Pt sat up in bed, alert and oriented. The pt remember CSW from previous admissions and appreciated the visit. The pt stated she is uncertain where she is in the Medicaid process however gave consent for CSW to contact her daughters. Pt would like to return to [place] [facility] at dc if finances are in place.

## 2011-07-03 NOTE — Discharge Instructions (Signed)
You were admitted due to chest pain. Fortunately the pain was not due to your heart. Please continue taking all medications as prescribed. Please follow up with the Family Medicine team at your facility. Have a great day.   Chest Pain (Nonspecific) Chest pain has many causes. Your pain could be caused by something serious, such as a heart attack or a blood clot in the lungs. It could also be caused by something less serious, such as a chest bruise or a virus. Follow up with your doctor. More lab tests or other studies may be needed to find the cause of your pain. Most of the time, nonspecific chest pain will improve within 2 to 3 days of rest and mild pain medicine. HOME CARE  For chest bruises, you may put ice on the sore area for 15 to 20 minutes, 3 to 4 times a day. Do this only if it makes you or your child feel better.   Put ice in a plastic bag.   Place a towel between the skin and the bag.   Rest for the next 2 to 3 days.   Go back to work if the pain improves.   See your doctor if the pain lasts longer than 1 to 2 weeks.   Only take medicine as told by your doctor.   Quit smoking if you smoke.  GET HELP RIGHT AWAY IF:   There is more pain or pain that spreads to the arm, neck, jaw, back, or belly (abdomen).   You or your child has shortness of breath.   You or your child coughs more than usual or coughs up blood.   You or your child has very bad back or belly pain, feels sick to his or her stomach (nauseous), or throws up (vomits).   You or your child has very bad weakness.   You or your child passes out (faints).   You or your child has a temperature by mouth above 102 F (38.9 C), not controlled by medicine.  Any of these problems may be serious and may be an emergency. Do not wait to see if the problems will go away. Get medical help right away. Call your local emergency services 911 in [place] Do not drive yourself to the hospital. MAKE SURE YOU:   Understand these  instructions.   Will watch this condition.   Will get help right away if you or your child is not doing well or gets worse.  Document Released: 07/17/2007 Document Revised: 01/17/2011 Document Reviewed: 07/17/2007 [facility] Patient Information 2012 [place], [place].

## 2011-07-03 NOTE — Care Management Note (Signed)
    Page 1 of 1   07/03/2011     11:03:48 AM   CARE MANAGEMENT NOTE 07/03/2011  Patient:  [name], [name]   Account Number:  [id number]  Date Initiated:  07/03/2011  Documentation initiated by:  SIMMONS,Torey Regan  Subjective/Objective Assessment:   PT ADMITTED WITH CHEST PAIN FROM [facility].     Action/Plan:   CSW AWARE;  FL2 ON SHADOW CHART.   Anticipated DC Date:  07/04/2011   Anticipated DC Plan:  SKILLED NURSING FACILITY         Choice offered to / List presented to:             Status of service:  Completed, signed off Medicare Important Message given?   (If response is "NO", the following Medicare IM given date fields will be blank) Date Medicare IM given:   Date Additional Medicare IM given:    Discharge Disposition:  SKILLED NURSING FACILITY  Per UR Regulation:  Reviewed for med. necessity/level of care/duration of stay  If discussed at Long Length of Stay Meetings, dates discussed:    Comments:  07/03/11  1103  Chryl Holten SIMMONS RN, BSN [phone]

## 2011-07-03 NOTE — Progress Notes (Signed)
UR Completed. Simmons, Kenneth Lax F [phone]  

## 2011-07-04 NOTE — ED Provider Notes (Signed)
I saw and evaluated the patient, reviewed the resident's note and I agree with the findings and plan.   .Face to face Exam:  General:  Awake HEENT:  Atraumatic Resp:  Normal effort Abd:  Nondistended Neuro:No focal weakness Lymph: No adenopathy   Nelia Shi, MD 07/04/11 2308

## 2011-07-04 NOTE — Discharge Summary (Signed)
I have reviewed this discharge summary and agree.    

## 2011-07-05 MED ORDER — CLONAZEPAM 0.5 MG PO TABS: 0.2500 mg | ORAL_TABLET | Freq: Two times a day (BID) | ORAL | Status: DC

## 2011-07-05 NOTE — Telephone Encounter (Signed)
Written and attached to fax to send to [facility]  

## 2011-07-16 ENCOUNTER — Encounter: Payer: Self-pay | Admitting: Pharmacist

## 2011-07-25 ENCOUNTER — Encounter: Payer: Self-pay | Admitting: Family Medicine

## 2011-07-25 ENCOUNTER — Non-Acute Institutional Stay: Payer: Self-pay | Admitting: Family Medicine

## 2011-07-25 ENCOUNTER — Other Ambulatory Visit: Payer: Self-pay | Admitting: Family Medicine

## 2011-07-25 DIAGNOSIS — E039 Hypothyroidism, unspecified: Secondary | ICD-10-CM

## 2011-07-25 DIAGNOSIS — E119 Type 2 diabetes mellitus without complications: Secondary | ICD-10-CM

## 2011-07-25 DIAGNOSIS — Z8673 Personal history of transient ischemic attack (TIA), and cerebral infarction without residual deficits: Secondary | ICD-10-CM

## 2011-07-25 DIAGNOSIS — F419 Anxiety disorder, unspecified: Secondary | ICD-10-CM

## 2011-07-25 DIAGNOSIS — E559 Vitamin D deficiency, unspecified: Secondary | ICD-10-CM | POA: Insufficient documentation

## 2011-07-25 DIAGNOSIS — E785 Hyperlipidemia, unspecified: Secondary | ICD-10-CM

## 2011-07-25 DIAGNOSIS — M79605 Pain in left leg: Secondary | ICD-10-CM

## 2011-07-25 MED ORDER — DULOXETINE HCL 30 MG PO CPEP: 60.0000 mg | ORAL_CAPSULE | Freq: Every day | ORAL | Status: DC

## 2011-07-25 MED ORDER — CLONAZEPAM 0.5 MG PO TABS: 0.2500 mg | ORAL_TABLET | Freq: Two times a day (BID) | ORAL | Status: DC | PRN

## 2011-07-25 MED ORDER — ERGOCALCIFEROL 1.25 MG (50000 UT) PO CAPS: 50000.0000 [IU] | ORAL_CAPSULE | ORAL | Status: DC

## 2011-07-25 MED ORDER — TRAMADOL HCL 50 MG PO TABS: 50.0000 mg | ORAL_TABLET | Freq: Three times a day (TID) | ORAL | Status: DC | PRN

## 2011-07-25 NOTE — Progress Notes (Signed)
Patient ID: [name], female   DOB: [birth year], 66 y.o.   MRN: [id number]  Subjective:    Patient ID: [name], female    DOB: 08-09-1945, 66 y.o.   MRN: [id number]  HPI resident note nursing home visit.  Left leg pain Chronic problem  patient though has been working with physical therapy and states that she thinks it feels better overall. Patient states she still has pain at the medicine does help. Tolerating Cymbalta- No nausea, no tremor. Not receiveing as needed Zanaflex    Hypertension Blood pressure at home:126/82 Blood pressure today: same Taking Meds:yes Side effects:no ROS: Denies headache visual changes nausea, vomiting, chest pain or abdominal pain or shortness of breath.   Diabetes:  High at home: 176 Low at home: 100 Taking medications: Yes please see med rec Side effects: No ROS: denies fever, chills, dizziness, loss of conscieness, polyuria poly dipsia numbness or tingling in extremities or chest pain. A1c improved from 8.3-7.2 in the last 3 months.  Review of Systems See HPI.    Objective:   Physical Exam  Constitutional: No distress.       WC  HENT:  Head: Normocephalic.  Eyes: EOM are normal. Pupils are equal, round, and reactive to light.  Neck: No thyromegaly present.  Cardiovascular: Normal rate, regular rhythm and normal heart sounds.   Pulmonary/Chest: Breath sounds normal.  Abdominal: Soft. She exhibits no distension. There is no tenderness.  Musculoskeletal: She exhibits no edema.       Patient does have mild contractures of the left lower shin and left upper tremor. Patient is wearing braces.  Neurological: She is alert. No cranial nerve deficit.  Psychiatric:       Patient's mood is here headache secondary to a pseudo-bulbar affect episodes of laughing and crying.   this though has improved since being treated. That is per our observation as well as nursing.     Assessment & Plan:

## 2011-07-25 NOTE — Assessment & Plan Note (Signed)
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 No need to adjust at this time.

## 2011-07-25 NOTE — Assessment & Plan Note (Signed)
Patient is doing well with new tx as stated above, would continue current therapy no need to increase dose.

## 2011-07-25 NOTE — Assessment & Plan Note (Signed)
Checked TSH recently seems to be doing well without any side effects, continue current therapy due for TSH in 8/13

## 2011-07-25 NOTE — Assessment & Plan Note (Signed)
Will taper patient to a 0.25mg  At night and then PRN during the day.

## 2011-07-26 ENCOUNTER — Other Ambulatory Visit: Payer: Self-pay | Admitting: Family Medicine

## 2011-07-26 MED ORDER — CLONAZEPAM 0.5 MG PO TABS: 0.2500 mg | ORAL_TABLET | Freq: Two times a day (BID) | ORAL | Status: DC | PRN

## 2011-07-31 ENCOUNTER — Other Ambulatory Visit: Payer: Self-pay | Admitting: Family Medicine

## 2011-07-31 MED ORDER — INSULIN ASPART PROT & ASPART (70-30 MIX) 100 UNIT/ML ~~LOC~~ SUSP: 22.0000 [IU] | Freq: Two times a day (BID) | SUBCUTANEOUS | Status: DC

## 2011-07-31 NOTE — Progress Notes (Signed)
Sugars have been running high recently, even though patient A1c has improved.  Patient will increase novolog to 22 units BID and watch, would not be too aggressive.

## 2011-08-21 ENCOUNTER — Non-Acute Institutional Stay: Payer: Self-pay | Admitting: Family Medicine

## 2011-08-21 DIAGNOSIS — R635 Abnormal weight gain: Secondary | ICD-10-CM | POA: Insufficient documentation

## 2011-08-21 DIAGNOSIS — E119 Type 2 diabetes mellitus without complications: Secondary | ICD-10-CM

## 2011-08-21 DIAGNOSIS — G890 Central pain syndrome: Secondary | ICD-10-CM

## 2011-08-21 DIAGNOSIS — M24549 Contracture, unspecified hand: Secondary | ICD-10-CM

## 2011-08-21 NOTE — Assessment & Plan Note (Signed)
Weight now 160. Her goal is to decrease to 150

## 2011-08-21 NOTE — Assessment & Plan Note (Signed)
well controlled  

## 2011-08-21 NOTE — Assessment & Plan Note (Signed)
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  Being treated by Dr Anne Hahn, neurologist in [facility] who plans Botox injection 09/02/11

## 2011-08-21 NOTE — Assessment & Plan Note (Signed)
Only partially improved on the Neulasta meds

## 2011-08-21 NOTE — Progress Notes (Unsigned)
  Subjective:    Patient ID: [name], female    DOB: 1945/06/22, 66 y.o.   MRN: [id number]  HPI Says her pain is currently 8/10. Described as chronic heaviness left side of thorax and pins and needles in left foot.  Hurts to move left shoulder. Being treated by Dr Anne Hahn, neurologist in [facility] who plans Botox injection 09/02/11 into her upper and lower left arm.  Her appetite is very good.    Review of Systems     Objective:   Physical Exam  Cardiovascular: Normal rate and regular rhythm.   Musculoskeletal: She exhibits no edema.       Unable to passively abduct her left shoulder due to pain.  Contracture left hand  Neurological: She is alert.       left hemiparesis  Psychiatric:       She was tearful, but able to speak clearly          Assessment & Plan:

## 2011-09-17 ENCOUNTER — Non-Acute Institutional Stay: Payer: Self-pay | Admitting: Family Medicine

## 2011-09-17 DIAGNOSIS — E039 Hypothyroidism, unspecified: Secondary | ICD-10-CM

## 2011-09-17 DIAGNOSIS — Z593 Problems related to living in residential institution: Secondary | ICD-10-CM

## 2011-09-17 DIAGNOSIS — I10 Essential (primary) hypertension: Secondary | ICD-10-CM

## 2011-09-17 DIAGNOSIS — Z8673 Personal history of transient ischemic attack (TIA), and cerebral infarction without residual deficits: Secondary | ICD-10-CM

## 2011-09-17 DIAGNOSIS — E119 Type 2 diabetes mellitus without complications: Secondary | ICD-10-CM

## 2011-09-17 DIAGNOSIS — G890 Central pain syndrome: Secondary | ICD-10-CM

## 2011-09-17 DIAGNOSIS — R635 Abnormal weight gain: Secondary | ICD-10-CM

## 2011-09-17 NOTE — Progress Notes (Signed)
  Subjective:    Patient ID: [name], female    DOB: [birth year], 66 y.o.   MRN: [id number]  HPI I visited [name] at  [facility] and introduced myself as her new PCP. Pt was very pleasant and communicative. Her complaint at this time is the pain produced by muscular spasms on her left side of her body. She recently went to Neurologist at [facility] and received 3 Botox injections. They alleviate her symptoms but no completely got rid of pain. She can only have this injections every three months per neuro indication. She is also on Physical therapy sessions and this helps as well. She received tramadol and reports not dramatic change on her baseline pain.  She mention has two daughters in [place] and [place] and the one living in [place] is trying to get an apartment for her. She is counting on continuing in this facility for at least two more months. Pt is also aware that her weight has increased and she is trying to lose around10 Lb, but she reports to be hungry between meals and all she can order are sandwiches.   Review of Systems Denies urinary symptoms, normal BM. No SOB, palpitations or chest pain.    Objective:   Physical Exam Constitutional: No distress, pleasant no episodes of pseudobulbar affect during visit. HENT:  Head: Normocephalic.  Eyes: EOM are normal. Normal conjunctiva, no discharge. Neck: No thyromegaly present. No LAD.   Cardiovascular: Normal rate, regular rhythm and normal heart sounds. No murmurs. Pulmonary/Chest: Breath sounds normal.  Abdominal: Soft.no distension, no tenderness.  Musculoskeletal: no edema. 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 This limits pt ROM. Lower extremity weakness, pt can walk but has limited ROM due to painful muscle contractions/spasms. Neurological: She is alert. No cranial nerve deficit. 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 Psychiatric: no  mood lability present during exam. Appropriate judgment.     Assessment & Plan:

## 2011-09-18 ENCOUNTER — Encounter: Payer: Self-pay | Admitting: Family Medicine

## 2011-09-18 NOTE — Assessment & Plan Note (Signed)
Stable weakness and spastic paresis. Recently injected with Botox. Mild improvement noticed.  No change in therapy. Next treatment for Botox in 3 months per pt reports.

## 2011-09-18 NOTE — Assessment & Plan Note (Signed)
Tramadol is not helping much. Will discuss with team pain management options.

## 2011-09-18 NOTE — Assessment & Plan Note (Signed)
Adequate control. Continue treatment. Next A1C due in September/2013.

## 2011-09-18 NOTE — Assessment & Plan Note (Signed)
Stable. Less episodes of pseudobulbar affect (emotional lability). Continue treatment.

## 2011-09-18 NOTE — Assessment & Plan Note (Signed)
Last weight 09/12/11 at 163.2. Going away from goal. Discussed with pt.

## 2011-09-18 NOTE — Assessment & Plan Note (Signed)
BP at goal. Not change on BP medications.

## 2011-09-18 NOTE — Assessment & Plan Note (Signed)
Will contact family to update about pt and to introduce myself as new PCP

## 2011-09-18 NOTE — Assessment & Plan Note (Signed)
Due for TSH on the 13 this month. Continue current treatment and evaluate/adjust therapy after TSH results

## 2011-10-02 ENCOUNTER — Other Ambulatory Visit: Payer: Self-pay | Admitting: Family Medicine

## 2011-10-02 MED ORDER — ARIPIPRAZOLE 2 MG PO TABS: 2.0000 mg | ORAL_TABLET | Freq: Every day | ORAL | Status: DC

## 2011-10-09 ENCOUNTER — Encounter: Payer: Self-pay | Admitting: Family Medicine

## 2011-10-09 DIAGNOSIS — H409 Unspecified glaucoma: Secondary | ICD-10-CM | POA: Insufficient documentation

## 2011-10-23 ENCOUNTER — Encounter: Payer: Self-pay | Admitting: Family Medicine

## 2011-10-23 DIAGNOSIS — H3581 Retinal edema: Secondary | ICD-10-CM | POA: Insufficient documentation

## 2011-10-31 ENCOUNTER — Encounter: Payer: Self-pay | Admitting: Family Medicine

## 2011-10-31 ENCOUNTER — Encounter: Payer: Self-pay | Admitting: Pharmacist

## 2011-11-06 ENCOUNTER — Other Ambulatory Visit: Payer: Self-pay | Admitting: Family Medicine

## 2011-11-06 MED ORDER — ERGOCALCIFEROL 1.25 MG (50000 UT) PO CAPS: 50000.0000 [IU] | ORAL_CAPSULE | ORAL | Status: AC

## 2011-11-07 ENCOUNTER — Non-Acute Institutional Stay: Payer: Self-pay | Admitting: Family Medicine

## 2011-11-07 DIAGNOSIS — E119 Type 2 diabetes mellitus without complications: Secondary | ICD-10-CM

## 2011-11-07 DIAGNOSIS — I6990 Unspecified sequelae of unspecified cerebrovascular disease: Secondary | ICD-10-CM

## 2011-11-07 MED ORDER — INSULIN ASPART PROT & ASPART (70-30 MIX) 100 UNIT/ML ~~LOC~~ SUSP: 22.0000 [IU] | Freq: Every day | SUBCUTANEOUS | Status: DC

## 2011-11-07 MED ORDER — INSULIN ASPART PROT & ASPART (70-30 MIX) 100 UNIT/ML ~~LOC~~ SUSP: 28.0000 [IU] | Freq: Every day | SUBCUTANEOUS | Status: DC

## 2011-11-07 NOTE — Assessment & Plan Note (Signed)
cbg's have been mainly in the 200's so her insulin was increased. A1c is due

## 2011-11-07 NOTE — Assessment & Plan Note (Signed)
Stable

## 2011-11-07 NOTE — Progress Notes (Signed)
  Subjective:    Patient ID: [name], female    DOB: Jul 04, 1945, 66 y.o.   MRN: [id number]  HPI She initially had no complaint.   left hemiparesis - As before she asks for OT treatment for her left hand contracture  Family stress - said her husband comes in occasionally. Started crying after that  Pseudobulbar affect - She says that medications haven't helped this.    Review of Systems     Objective:   Physical Exam  Constitutional: She appears well-developed and well-nourished.  Cardiovascular: Normal rate and regular rhythm.   Pulmonary/Chest: Effort normal and breath sounds normal.  Musculoskeletal:       Mild contracture left hand  Neurological: She is alert.  Psychiatric:       Easily begins crying and has trouble stopping          Assessment & Plan:

## 2011-11-07 NOTE — Assessment & Plan Note (Signed)
Questionable response to Neudexa equivalent medications

## 2011-11-14 ENCOUNTER — Non-Acute Institutional Stay: Payer: Self-pay | Admitting: Family Medicine

## 2011-11-14 MED ORDER — HYDROCORTISONE 2.5 % RE CREA: TOPICAL_CREAM | Freq: Two times a day (BID) | RECTAL | Status: AC

## 2011-11-14 NOTE — Progress Notes (Signed)
Patient with c/o rectal pain- hemerrhoid noted by staff. Pt says pain has been present x3 days, seems to be improving.  Pain is mostly with wiping. Is able to sit on it ok.  PE: NAD, pleasant Rectal: 3cm thrombosed external hemorrhoid, mildly TTP  A/P: anusol HC BID x7 days -consider drainage if pain worsens, although since it seems to be improving on its own and pain is mild to moderate, will not I&D it today.

## 2011-11-19 ENCOUNTER — Encounter: Payer: Self-pay | Admitting: Family Medicine

## 2011-12-06 LAB — TSH: TSH: 2.632

## 2011-12-06 LAB — HEMOGLOBIN A1C: A1c: 7.3

## 2011-12-20 LAB — HEMOGLOBIN A1C
Hgb A1c MFr Bld: 7.8 % — AB (ref 4.0–6.0)
Hgb A1c MFr Bld: 7.9 % — AB (ref 4.0–6.0)

## 2012-01-07 ENCOUNTER — Encounter: Payer: Self-pay | Admitting: Pharmacist

## 2012-01-07 DIAGNOSIS — F419 Anxiety disorder, unspecified: Secondary | ICD-10-CM

## 2012-01-08 ENCOUNTER — Non-Acute Institutional Stay: Payer: Self-pay | Admitting: Family Medicine

## 2012-01-08 DIAGNOSIS — J3089 Other allergic rhinitis: Secondary | ICD-10-CM

## 2012-01-08 DIAGNOSIS — E119 Type 2 diabetes mellitus without complications: Secondary | ICD-10-CM

## 2012-01-08 DIAGNOSIS — R635 Abnormal weight gain: Secondary | ICD-10-CM

## 2012-01-11 DIAGNOSIS — J3089 Other allergic rhinitis: Secondary | ICD-10-CM | POA: Insufficient documentation

## 2012-01-11 MED ORDER — LORATADINE 10 MG PO TABS: 10.0000 mg | ORAL_TABLET | Freq: Every day | ORAL | Status: DC

## 2012-01-11 MED ORDER — INSULIN ASPART PROT & ASPART (70-30 MIX) 100 UNIT/ML ~~LOC~~ SUSP: 26.0000 [IU] | Freq: Every day | SUBCUTANEOUS | Status: DC

## 2012-01-11 NOTE — Progress Notes (Signed)
  Subjective:    Patient ID: [name], female    DOB: 1945-07-25, 66 y.o.   MRN: [id number]  HPI complains of nasal congestion, post nasal drip, tickle cough which she says she gets every winter. Thinks that it is allergies. No sneezing.    Review of Systems     Objective:   Physical Exam  Constitutional: She appears well-developed and well-nourished.  HENT:  Mouth/Throat: Oropharynx is clear and moist.  Eyes: Conjunctivae normal are normal. Right eye exhibits no discharge. Left eye exhibits no discharge.  Cardiovascular: Normal rate and regular rhythm.   Pulmonary/Chest: Effort normal and breath sounds normal.  Lymphadenopathy:    She has no cervical adenopathy.  Neurological: She is alert.          Assessment & Plan:

## 2012-01-11 NOTE — Assessment & Plan Note (Signed)
Novalog 70/30 increased to 26 units in the AM and 22 in the PM

## 2012-01-11 NOTE — Assessment & Plan Note (Signed)
Loratadine 10 mg daily prn

## 2012-01-11 NOTE — Assessment & Plan Note (Signed)
Continues to gain weight.

## 2012-01-16 ENCOUNTER — Non-Acute Institutional Stay: Payer: Self-pay | Admitting: Family Medicine

## 2012-01-20 ENCOUNTER — Encounter: Payer: Self-pay | Admitting: Family Medicine

## 2012-01-20 ENCOUNTER — Non-Acute Institutional Stay: Payer: Self-pay | Admitting: Family Medicine

## 2012-01-20 DIAGNOSIS — E119 Type 2 diabetes mellitus without complications: Secondary | ICD-10-CM

## 2012-01-20 DIAGNOSIS — E785 Hyperlipidemia, unspecified: Secondary | ICD-10-CM

## 2012-01-20 DIAGNOSIS — I10 Essential (primary) hypertension: Secondary | ICD-10-CM

## 2012-01-20 DIAGNOSIS — E039 Hypothyroidism, unspecified: Secondary | ICD-10-CM

## 2012-01-20 NOTE — Progress Notes (Signed)
Family Medicine Nursing Home Visit Note   Subjective:   Patient ID: [name], female  DOB: Mar 17, 1945, 66 y.o.. MRN: [id number]   Visit pt at [facility]. She reports feeling well. Her complaint today are the spasms present on LE mostly on her left side. She reports discussing with Dr. Sheffield Slider the possibility of starting Baclofen.   Pseudobulbar affect: pt stable emotionally. Reports meds are helping. Denies side effects. Pt on Abilify  HTN: on Lisinopril 5 mg, norvasc 5 mg. BP controlled  Hyperlipidemia: last Lipid profile 2/19.13 with elevated LDL and TG. On Lipitor 40 mg daily. Denies side effect of medication.  Hypothyroidism: lat TSH was 10/31/11 at 2.6. Pt is on Levothyroxine 50 mcg daily.  DM: last A1C 12/30/11 at 7.8. On insulin regimen 70/30. No hypoglycemic episodes reported.  Social: She also reports that her daughters were involved trying to get her back to [place] with them for Christmas but she thinks this will probably not take place until next year since she was told the state will need to approve her transfer(?)  Review of Systems:  Pt denies SOB, chest pain, palpitations, headaches, dizziness. No changes on urinary or BM habits..  Objective:   Physical Exam: Gen:  NAD HEENT: Moist mucous membranes  CV: Regular rate and rhythm, no murmurs rubs or gallops PULM: Clear to auscultation bilaterally. No wheezes/rales/rhonchi ABD: Soft, non tender, non distended, normal bowel sounds EXT: No edema Neuro: Alert and oriented x3. Left sided hemiparesis  Assessment & Plan:

## 2012-01-22 NOTE — Assessment & Plan Note (Signed)
Last lipid profile 03/2011. Repeat labs.

## 2012-01-22 NOTE — Assessment & Plan Note (Signed)
TSH wnl. Continue levothyroxine 50 mcg.

## 2012-01-22 NOTE — Assessment & Plan Note (Signed)
Continue current regimen with 70/30 insulin.

## 2012-01-22 NOTE — Assessment & Plan Note (Signed)
Controlled. Continue current regimen. 

## 2012-01-31 ENCOUNTER — Non-Acute Institutional Stay: Payer: Self-pay | Admitting: Family Medicine

## 2012-01-31 NOTE — Progress Notes (Signed)
Questionable benefit of tizanidine, attempting med simplification

## 2012-02-13 ENCOUNTER — Non-Acute Institutional Stay: Payer: Self-pay | Admitting: Family Medicine

## 2012-02-13 DIAGNOSIS — F419 Anxiety disorder, unspecified: Secondary | ICD-10-CM

## 2012-02-13 DIAGNOSIS — I6990 Unspecified sequelae of unspecified cerebrovascular disease: Secondary | ICD-10-CM

## 2012-02-13 MED ORDER — DULOXETINE HCL 30 MG PO CPEP: 60.0000 mg | ORAL_CAPSULE | Freq: Every day | ORAL | Status: DC

## 2012-02-13 MED ORDER — TIZANIDINE HCL 4 MG PO TABS: 4.0000 mg | ORAL_TABLET | Freq: Three times a day (TID) | ORAL | Status: DC

## 2012-02-13 NOTE — Assessment & Plan Note (Signed)
Worsened spasticity on the lower dose of Tizanidine, so it was raised back to 4 mg three times daily. We'll reassess next week to see if she needs the Cymbalta raised back to 60 mg to help depression and central pain.

## 2012-02-13 NOTE — Progress Notes (Signed)
  Subjective:    Patient ID: [name], female    DOB: 1945-05-05, 67 y.o.   MRN: [id number]  HPI We had decreased Cymbalta to 30 mg then a week later decreased her Tizanidine to 2 mg three times daily. She reports that she has has increased pain in her left side associated with increased spasticity. This makes her uncomfortable, especially while in bed. She usually has trouble sleeping, being unable to maintain positive thoughts.   The plans for her to move to [place] to be near her family depend on the processing of the paperwork that I sent in.   Review of Systems     Objective:   Physical Exam Alert sitting in a wheelchair with tight spasticity of her left extremities. She became tearful intermittently, but quickly returned to a normal affect.        Assessment & Plan:

## 2012-02-14 ENCOUNTER — Encounter: Payer: Self-pay | Admitting: Pharmacist

## 2012-02-20 ENCOUNTER — Encounter: Payer: Self-pay | Admitting: Family Medicine

## 2012-02-20 ENCOUNTER — Other Ambulatory Visit: Payer: Self-pay | Admitting: Family Medicine

## 2012-02-20 MED ORDER — DULOXETINE HCL 60 MG PO CPEP: 60.0000 mg | ORAL_CAPSULE | Freq: Every day | ORAL | Status: DC

## 2012-02-21 LAB — HEMOGLOBIN A1C: Hgb A1c MFr Bld: 8.3 % — AB (ref 4.0–6.0)

## 2012-02-24 ENCOUNTER — Non-Acute Institutional Stay: Payer: Self-pay | Admitting: Family Medicine

## 2012-02-25 MED ORDER — INSULIN ASPART PROT & ASPART (70-30 MIX) 100 UNIT/ML ~~LOC~~ SUSP: 26.0000 [IU] | Freq: Every day | SUBCUTANEOUS | Status: DC

## 2012-02-25 NOTE — Progress Notes (Signed)
Patient ID: [name], female   DOB: Aug 30, 1945, 67 y.o.   MRN: [id number]  67 yo F nursing home patient with elevated A1c.  Lab Results  Component Value Date   HGBA1C 8.3* 02/21/2012   CBGs: Last 4 weeks 62-446 Last 2 weeks 62-387  62: 02-23-12 12:40 PM 387 02-22-12 10 20  PM.  Insulin Novolog 70/30  26 U with breakfast 22 U with dinner   A: elevated evening blood sugars P:  Increase PM insulin to 26 Units.  Follow CBGs.

## 2012-03-23 LAB — LIPID PANEL
HDL: 35 mg/dL (ref 35–70)
Total CHOL/HDL Ratio: 2.9
Triglyceride fasting, serum: 108
VLDL: 22 mg/dL

## 2012-03-23 LAB — HEPATIC FUNCTION PANEL
Albumin: 4.4
Alkaline Phosphatase: 83 U/L
Total Protein: 6.7 g/dL

## 2012-03-25 ENCOUNTER — Non-Acute Institutional Stay: Payer: Self-pay | Admitting: Family Medicine

## 2012-03-25 ENCOUNTER — Encounter: Payer: Self-pay | Admitting: Family Medicine

## 2012-03-25 DIAGNOSIS — I10 Essential (primary) hypertension: Secondary | ICD-10-CM

## 2012-03-25 DIAGNOSIS — E119 Type 2 diabetes mellitus without complications: Secondary | ICD-10-CM

## 2012-03-25 NOTE — Assessment & Plan Note (Signed)
Controlled. Continue current regimen. 

## 2012-03-25 NOTE — Assessment & Plan Note (Signed)
A1C not at goal.  Insulin increased to 26 units BID ( PM and at supper)

## 2012-03-25 NOTE — Progress Notes (Signed)
Family Medicine Nursing Home Visit   Subjective:   Patient ID: [name], female  DOB: [birth year], 67 y.o.. MRN: [id number]   Nursing home visit.  Pt denies complaints today. She is hopefull that her daughter can find a service that can provide supervision for pt 24/7 . Pt has been told that her daughter found an apartment in [place] that she can move-in maybe in  March.  Cymbalta has been increased to 60 mg a day since pt reports periods of feeling sad. Denies symptoms of depression at this time. Tizanidine has also been increased back to 4 mg Po TID and pt reports that has helped with her spasms.  Objective:   Physical Exam: Gen:  sitting on wheelchair. NAD HEENT: Moist mucous membranes  CV: Regular rate and rhythm, no murmurs PULM: Clear to auscultation bilaterally.  ABD: Soft, non tender, normal BS EXT: No edema Neuro: Alert and oriented x3. Spasticity of left upper and lower extremities.  Assessment & Plan:

## 2012-03-25 NOTE — Assessment & Plan Note (Signed)
On Cymbalta now on 60 mg daily. Pt reports feeling better. Continue same dosing.

## 2012-04-02 ENCOUNTER — Telehealth: Payer: Self-pay | Admitting: Family Medicine

## 2012-04-02 NOTE — Telephone Encounter (Signed)
Will fwd. To Dr.Hale for review. .Mats Jeanlouis  

## 2012-04-02 NOTE — Telephone Encounter (Signed)
Daughter is calling because she will be coming to move her back up [place] on 04/09/12.  She would like to speak to Dr. Sheffield Slider about what exactly needs to be done before she leaves [facility].  The address that she will be moving to [address], [place], [place] [address].  If Dr. Sheffield Slider can suggest names and phone numbers of any medical professionals in the area.

## 2012-04-06 NOTE — Telephone Encounter (Signed)
I called and spoke to patient's daughter, [name].  Patient will be moving to [employer] in [place].  Daughter will drive to [facility] to pick up patient this weekend.  They will drive down Saturday and sign her out Sunday, March 2nd.  Since this is a transfer from a SNF to an ALF, patient will need several discharge orders: - Wheelchair (in [place]), diapers, hospital bed, bedside commode, diabetic supplies - Home health aid for 8 hours per day - Physical Therapy  I also spoke to the [facility] RN who will speak to the Social worker regarding discharge paperwork.    Hopefully, all will be completed by Friday.

## 2012-04-06 NOTE — Telephone Encounter (Signed)
Is asking to speak with Dr  Tye Savoy about her mom's move - just has a few questions

## 2012-04-08 ENCOUNTER — Non-Acute Institutional Stay: Payer: Medicare Other | Admitting: Family Medicine

## 2012-04-08 DIAGNOSIS — I6990 Unspecified sequelae of unspecified cerebrovascular disease: Secondary | ICD-10-CM

## 2012-04-08 DIAGNOSIS — I10 Essential (primary) hypertension: Secondary | ICD-10-CM

## 2012-04-08 NOTE — Assessment & Plan Note (Signed)
Stable with continued discomfort from contractures. Won't make any further medication adjustments before her move.

## 2012-04-08 NOTE — Progress Notes (Signed)
  Subjective:    Patient ID: [name], female    DOB: Jun 29, 1945, 67 y.o.   MRN: [id number]  HPI Move - Her daughters will take her to [place] in 3 days to live in an apartment in housing for the elderly. She will get her meals through [employer]. Her daughter lives 3 blocks away.   She continues to have spasticity discomfort in her right side, particularly in the thigh. Her appetite is good. She has no chest pain or shortness of breath with exertion.   Review of Systems     Objective:   Physical Exam  Constitutional: She appears well-developed and well-nourished.  Riding a reclined exercycle at a slow pace.   Cardiovascular: Normal rate and regular rhythm.   No murmur heard. Pulmonary/Chest: Effort normal and breath sounds normal. She has no rales.  Musculoskeletal: She exhibits no edema.  left sided paresis and spasticity limiting full extension of joints.   Psychiatric: She has a normal mood and affect.  No crying. Happy about the coming move          Assessment & Plan:

## 2012-04-08 NOTE — Assessment & Plan Note (Signed)
well controlled  

## 2012-04-09 ENCOUNTER — Other Ambulatory Visit: Payer: Self-pay | Admitting: Family Medicine

## 2012-04-09 DIAGNOSIS — I10 Essential (primary) hypertension: Secondary | ICD-10-CM

## 2012-04-09 DIAGNOSIS — E559 Vitamin D deficiency, unspecified: Secondary | ICD-10-CM

## 2012-04-09 DIAGNOSIS — H04123 Dry eye syndrome of bilateral lacrimal glands: Secondary | ICD-10-CM

## 2012-04-09 DIAGNOSIS — J3089 Other allergic rhinitis: Secondary | ICD-10-CM

## 2012-04-09 DIAGNOSIS — E785 Hyperlipidemia, unspecified: Secondary | ICD-10-CM

## 2012-04-09 MED ORDER — LISINOPRIL 5 MG PO TABS: 5.0000 mg | ORAL_TABLET | Freq: Every day | ORAL | Status: AC

## 2012-04-09 MED ORDER — ACETAMINOPHEN 325 MG PO TABS: 650.0000 mg | ORAL_TABLET | Freq: Three times a day (TID) | ORAL | Status: DC

## 2012-04-09 MED ORDER — LATANOPROST 0.005 % OP SOLN: 1.0000 [drp] | Freq: Every day | OPHTHALMIC | Status: DC

## 2012-04-09 MED ORDER — CLONAZEPAM 0.5 MG PO TABS: 0.2500 mg | ORAL_TABLET | Freq: Two times a day (BID) | ORAL | Status: DC | PRN

## 2012-04-09 MED ORDER — INSULIN ASPART PROT & ASPART (70-30 MIX) 100 UNIT/ML ~~LOC~~ SUSP: 26.0000 [IU] | Freq: Every day | SUBCUTANEOUS | Status: DC

## 2012-04-09 MED ORDER — TIZANIDINE HCL 4 MG PO TABS: 4.0000 mg | ORAL_TABLET | Freq: Three times a day (TID) | ORAL | Status: AC | PRN

## 2012-04-09 MED ORDER — ARIPIPRAZOLE 2 MG PO TABS: 2.0000 mg | ORAL_TABLET | Freq: Every day | ORAL | Status: AC

## 2012-04-09 MED ORDER — INSULIN ASPART PROT & ASPART (70-30 MIX) 100 UNIT/ML ~~LOC~~ SUSP: 26.0000 [IU] | Freq: Every day | SUBCUTANEOUS | Status: AC

## 2012-04-09 MED ORDER — LEVOTHYROXINE SODIUM 50 MCG PO TABS: 50.0000 ug | ORAL_TABLET | Freq: Every day | ORAL | Status: DC

## 2012-04-09 MED ORDER — AMLODIPINE BESYLATE 5 MG PO TABS: 5.0000 mg | ORAL_TABLET | Freq: Every day | ORAL | Status: AC

## 2012-04-09 MED ORDER — TIZANIDINE HCL 4 MG PO TABS: 4.0000 mg | ORAL_TABLET | Freq: Three times a day (TID) | ORAL | Status: DC | PRN

## 2012-04-09 MED ORDER — LEVOTHYROXINE SODIUM 50 MCG PO TABS: 50.0000 ug | ORAL_TABLET | Freq: Every day | ORAL | Status: AC

## 2012-04-09 MED ORDER — ARIPIPRAZOLE 2 MG PO TABS: 2.0000 mg | ORAL_TABLET | Freq: Every day | ORAL | Status: DC

## 2012-04-09 MED ORDER — LORATADINE 10 MG PO TABS: 10.0000 mg | ORAL_TABLET | Freq: Every day | ORAL | Status: AC

## 2012-04-09 MED ORDER — ACETAMINOPHEN 325 MG PO TABS: 650.0000 mg | ORAL_TABLET | Freq: Three times a day (TID) | ORAL | Status: AC

## 2012-04-09 MED ORDER — DULOXETINE HCL 60 MG PO CPEP: 60.0000 mg | ORAL_CAPSULE | Freq: Every day | ORAL | Status: DC

## 2012-04-09 MED ORDER — ATORVASTATIN CALCIUM 40 MG PO TABS: 40.0000 mg | ORAL_TABLET | Freq: Every day | ORAL | Status: AC

## 2012-04-09 MED ORDER — CLOPIDOGREL BISULFATE 75 MG PO TABS: 75.0000 mg | ORAL_TABLET | Freq: Every day | ORAL | Status: AC

## 2012-04-09 MED ORDER — CLONAZEPAM 0.5 MG PO TABS: 0.2500 mg | ORAL_TABLET | Freq: Two times a day (BID) | ORAL | Status: AC | PRN

## 2012-04-09 MED ORDER — DULOXETINE HCL 60 MG PO CPEP: 60.0000 mg | ORAL_CAPSULE | Freq: Every day | ORAL | Status: AC

## 2012-04-09 MED ORDER — POLYETHYLENE GLYCOL 3350 17 G PO PACK: 17.0000 g | PACK | Freq: Every day | ORAL | Status: AC

## 2012-04-09 MED ORDER — CLOPIDOGREL BISULFATE 75 MG PO TABS: 75.0000 mg | ORAL_TABLET | Freq: Every day | ORAL | Status: DC

## 2012-04-09 MED ORDER — CARBOXYMETHYLCELLULOSE SODIUM 1 % OP SOLN: 1.0000 [drp] | OPHTHALMIC | Status: AC | PRN

## 2012-04-09 MED ORDER — AMLODIPINE BESYLATE 5 MG PO TABS: 5.0000 mg | ORAL_TABLET | Freq: Every day | ORAL | Status: DC

## 2012-05-04 ENCOUNTER — Other Ambulatory Visit: Payer: Self-pay | Admitting: Family Medicine

## 2012-05-12 ENCOUNTER — Other Ambulatory Visit: Payer: Self-pay | Admitting: Family Medicine

## 2012-05-16 ENCOUNTER — Telehealth: Payer: Self-pay

## 2012-05-16 NOTE — Telephone Encounter (Signed)
pts daughter [name] would like to speak with dr hopper about pt. States pt is a stroke pt and has now moved up to [place] to live with daughter.   Best: [phone]  bf

## 2012-05-18 NOTE — Telephone Encounter (Signed)
Called patients daughter.

## 2012-05-18 NOTE — Telephone Encounter (Signed)
Left message for her to call me back. 

## 2012-05-21 NOTE — Telephone Encounter (Signed)
To you FYI patients daughter called but when I called her back, she did not answer and did not return my calls.

## 2012-05-26 NOTE — Telephone Encounter (Signed)
Noted  

## 2012-06-19 IMAGING — CR DG CHEST 2V
2 series · 2 of 2 positions shown · non-contrast
Comparison: 04/02/2011

CLINICAL DATA: Chest pain

CHEST - 2 VIEW

[w chest lat]
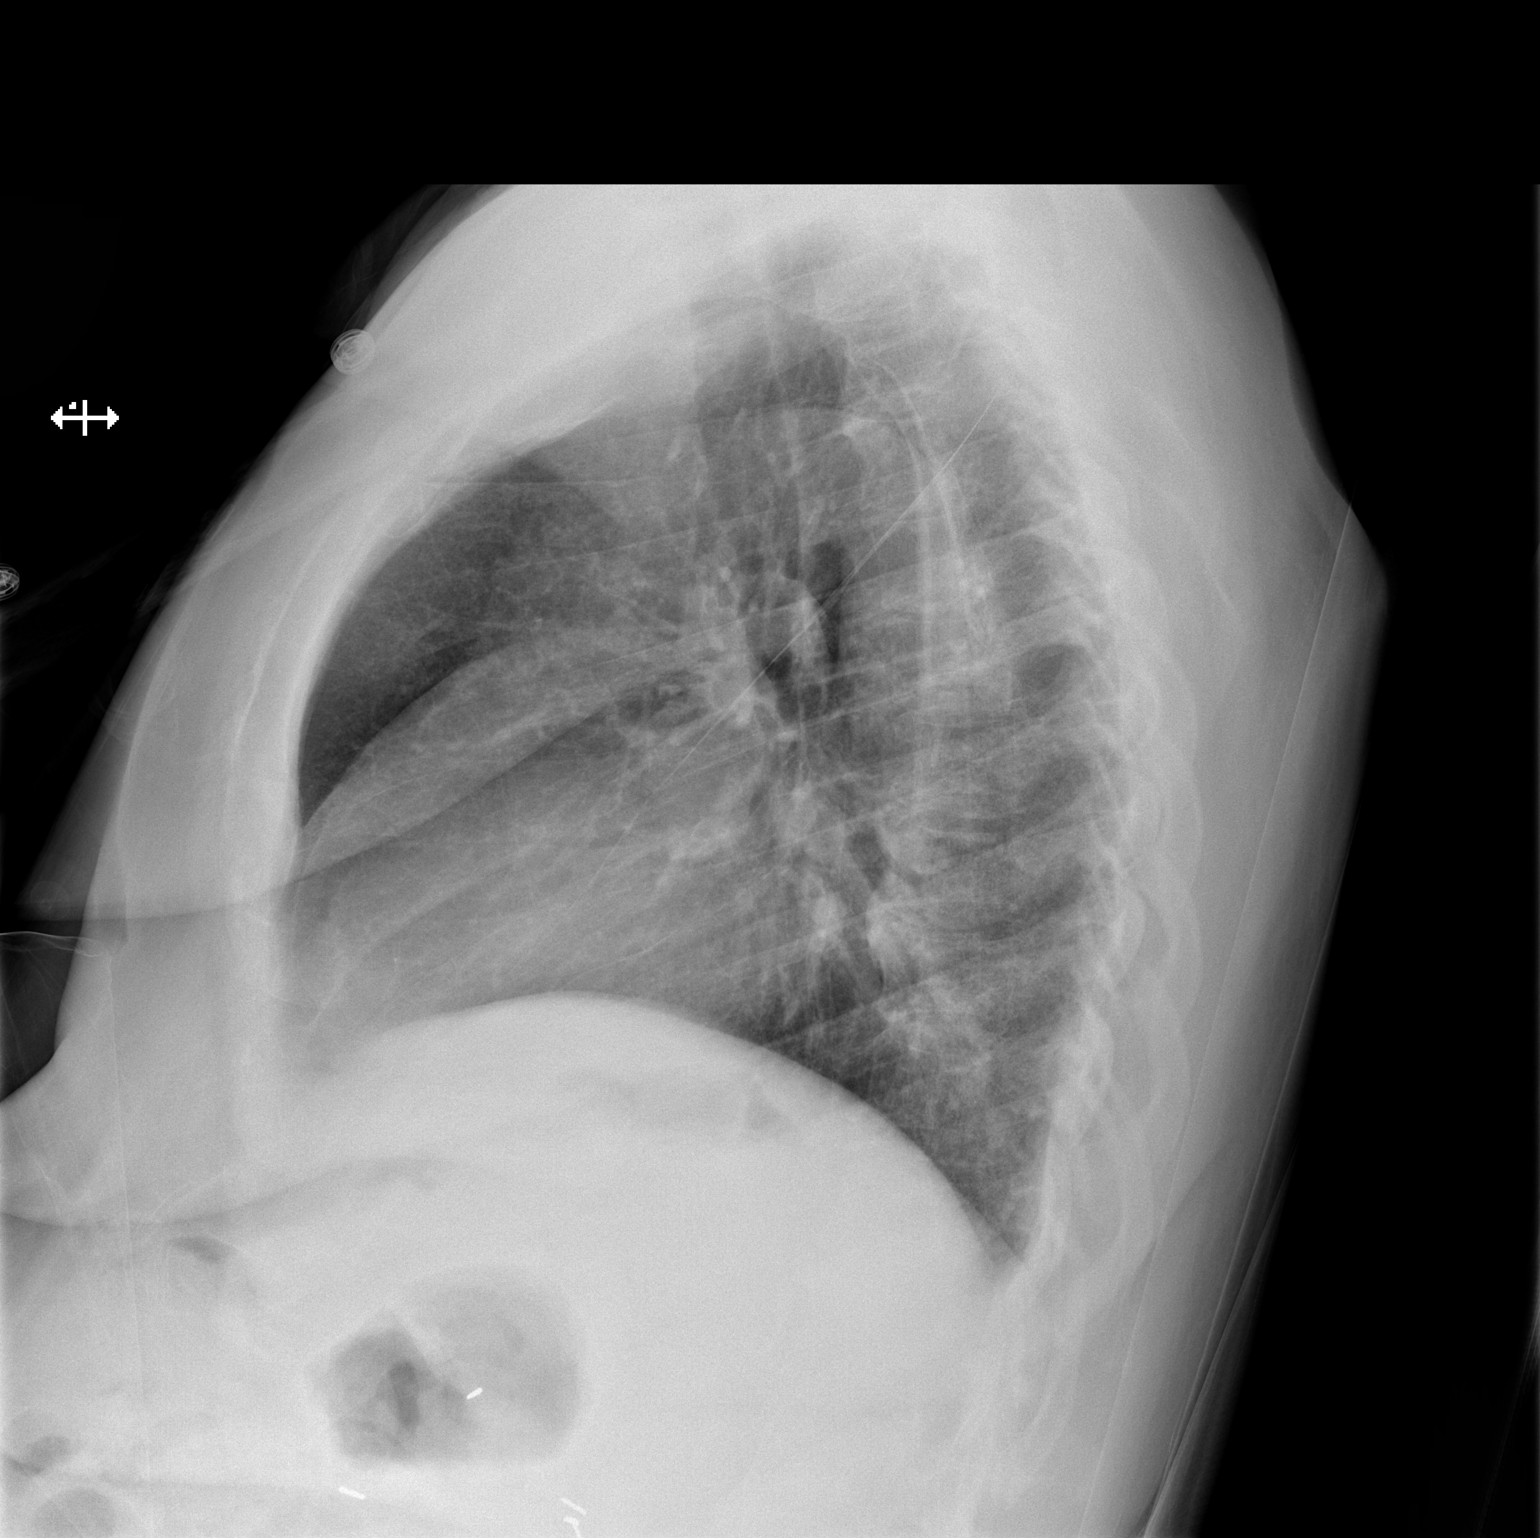

[x chest ap]
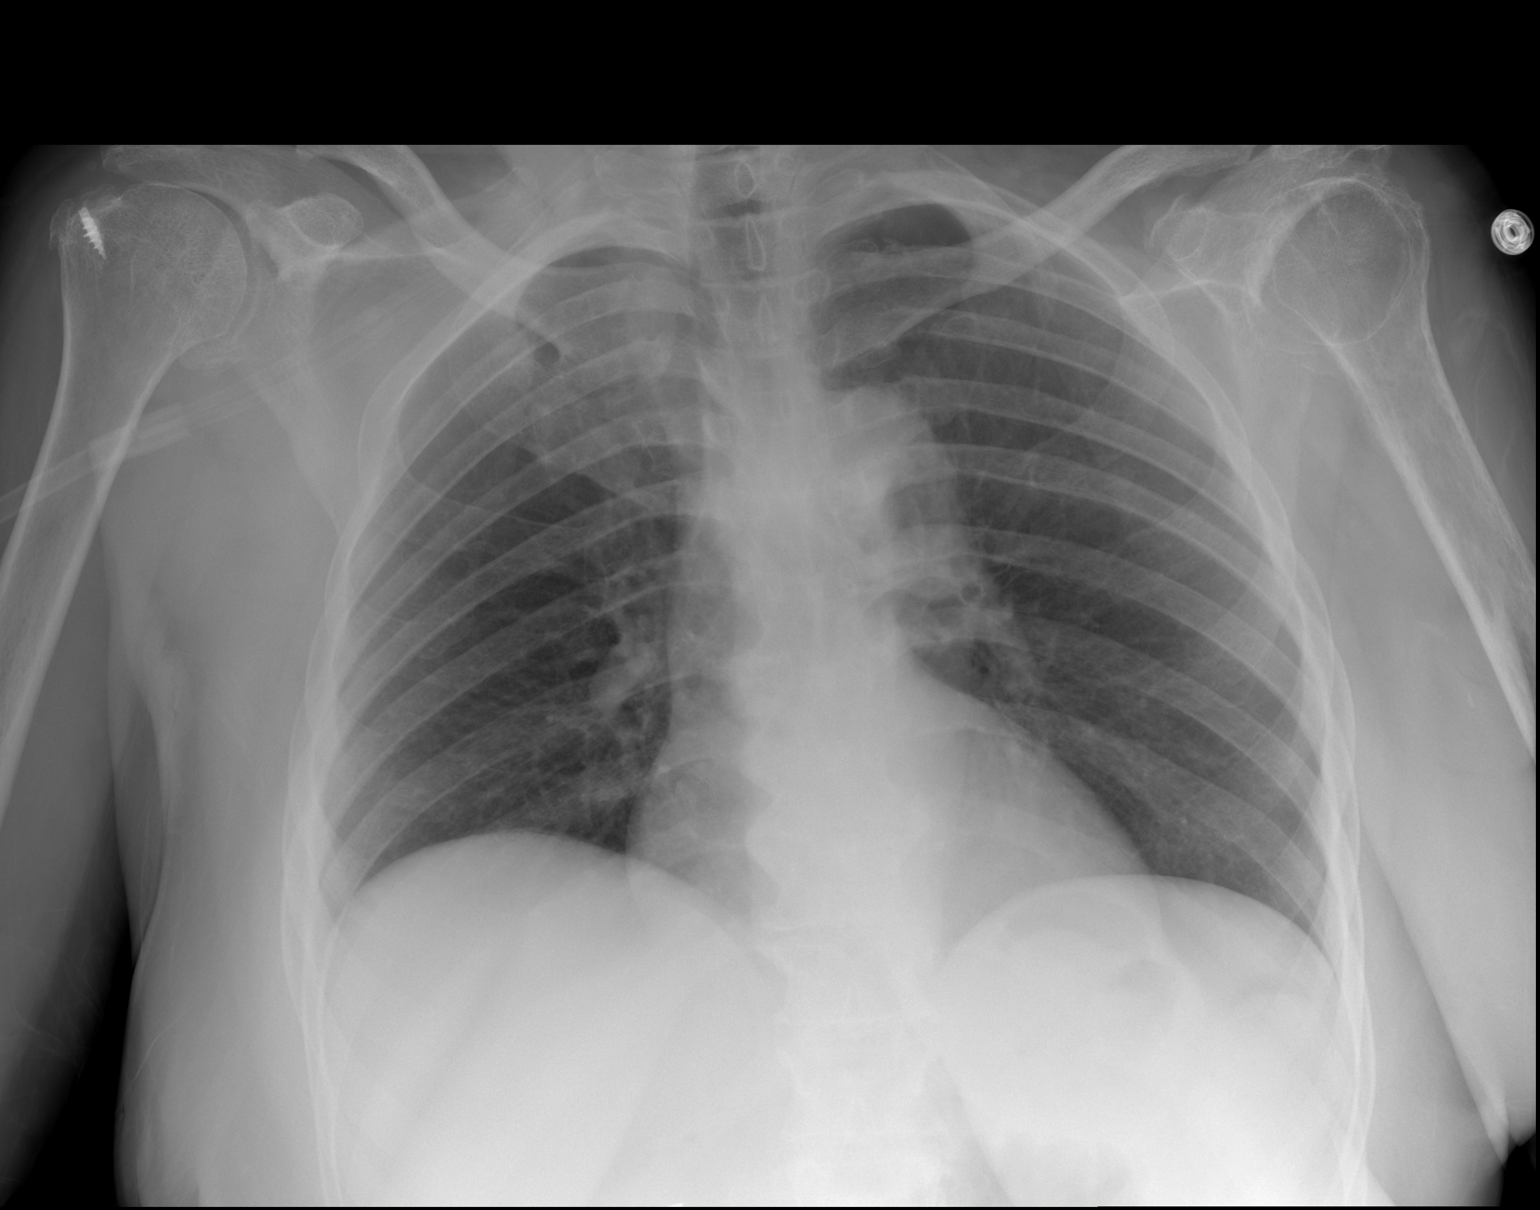

[2 of 2 positions shown; findings below may reference images not displayed]

FINDINGS: Degraded by rotation.  Within this limitation,
cardiomediastinal contours are unchanged.  No focal consolidation.
No pleural effusion or pneumothorax.  Multilevel degenerative
changes.  Advanced left shoulder DJD.  Postsurgical changes of the
right shoulder.  Distal right clavicle has been resected.
IMPRESSION: No radiographic evidence of acute cardiopulmonary process.

## 2014-11-29 ENCOUNTER — Telehealth: Payer: Self-pay

## 2014-11-29 NOTE — Telephone Encounter (Signed)
Jola BabinskiMarilyn, daughter called to let Dr. Alwyn RenHopper know this patient passed away on October 15.  She appreciates everything he did for her mom.   [phone]

## 2014-12-01 NOTE — Telephone Encounter (Signed)
Return to the call. The patient's mother had been my patient and passed away a couple of days ago. Offered my condolences.  Peyton Najjaravid H Hopper M.D.

## 2014-12-13 DEATH — deceased
